# Patient Record
Sex: Female | Born: 1955 | Race: White | Hispanic: No | Marital: Married | Smoking: Former smoker
Health system: Southern US, Community
[De-identification: ages and names within clinical notes are randomized; demographics above are authoritative.]

## PROBLEM LIST (undated history)

## (undated) DIAGNOSIS — G500 Trigeminal neuralgia: Secondary | ICD-10-CM

## (undated) DIAGNOSIS — Z86010 Personal history of colonic polyps: Secondary | ICD-10-CM

## (undated) DIAGNOSIS — G43909 Migraine, unspecified, not intractable, without status migrainosus: Secondary | ICD-10-CM

## (undated) DIAGNOSIS — F17200 Nicotine dependence, unspecified, uncomplicated: Secondary | ICD-10-CM

## (undated) HISTORY — DX: Nicotine dependence, unspecified, uncomplicated: F17.200

## (undated) HISTORY — PX: TUBAL LIGATION: SHX77

## (undated) HISTORY — PX: KNEE ARTHROSCOPY: SUR90

## (undated) HISTORY — PX: APPENDECTOMY: SHX54

## (undated) HISTORY — PX: COLONOSCOPY: SHX174

## (undated) HISTORY — DX: Personal history of colonic polyps: Z86.010

## (undated) HISTORY — DX: Trigeminal neuralgia: G50.0

## (undated) HISTORY — DX: Migraine, unspecified, not intractable, without status migrainosus: G43.909

---

## 1993-09-07 HISTORY — PX: BRAIN SURGERY: SHX531

## 2001-05-09 ENCOUNTER — Emergency Department (HOSPITAL_COMMUNITY): Admission: EM | Admit: 2001-05-09 | Discharge: 2001-05-09 | Payer: Self-pay | Admitting: Emergency Medicine

## 2004-07-05 ENCOUNTER — Emergency Department (HOSPITAL_COMMUNITY): Admission: AD | Admit: 2004-07-05 | Discharge: 2004-07-05 | Payer: Self-pay | Admitting: Family Medicine

## 2006-07-26 ENCOUNTER — Ambulatory Visit: Payer: Self-pay | Admitting: Family Medicine

## 2006-07-26 ENCOUNTER — Encounter: Admission: RE | Admit: 2006-07-26 | Discharge: 2006-07-26 | Payer: Self-pay | Admitting: Family Medicine

## 2007-01-13 ENCOUNTER — Emergency Department (HOSPITAL_COMMUNITY): Admission: EM | Admit: 2007-01-13 | Discharge: 2007-01-14 | Payer: Self-pay | Admitting: Emergency Medicine

## 2007-05-09 ENCOUNTER — Emergency Department (HOSPITAL_COMMUNITY): Admission: EM | Admit: 2007-05-09 | Discharge: 2007-05-09 | Payer: Self-pay | Admitting: Emergency Medicine

## 2007-05-18 ENCOUNTER — Ambulatory Visit: Payer: Self-pay | Admitting: Internal Medicine

## 2007-06-02 ENCOUNTER — Ambulatory Visit: Payer: Self-pay | Admitting: Internal Medicine

## 2007-07-07 ENCOUNTER — Ambulatory Visit: Payer: Self-pay | Admitting: Internal Medicine

## 2008-07-04 ENCOUNTER — Ambulatory Visit: Payer: Self-pay | Admitting: Family Medicine

## 2008-07-10 ENCOUNTER — Ambulatory Visit: Payer: Self-pay | Admitting: Internal Medicine

## 2008-07-19 ENCOUNTER — Ambulatory Visit: Payer: Self-pay | Admitting: Family Medicine

## 2008-07-19 ENCOUNTER — Encounter: Payer: Self-pay | Admitting: Family Medicine

## 2008-07-19 ENCOUNTER — Other Ambulatory Visit: Admission: RE | Admit: 2008-07-19 | Discharge: 2008-07-19 | Payer: Self-pay | Admitting: Family Medicine

## 2008-07-19 LAB — HM PAP SMEAR: HM Pap smear: NEGATIVE

## 2008-07-24 ENCOUNTER — Ambulatory Visit: Payer: Self-pay | Admitting: Internal Medicine

## 2008-07-24 ENCOUNTER — Encounter: Payer: Self-pay | Admitting: Internal Medicine

## 2008-07-24 DIAGNOSIS — Z86010 Personal history of colonic polyps: Secondary | ICD-10-CM

## 2008-07-24 HISTORY — DX: Personal history of colonic polyps: Z86.010

## 2008-07-24 LAB — HM COLONOSCOPY

## 2008-07-30 ENCOUNTER — Encounter: Payer: Self-pay | Admitting: Internal Medicine

## 2009-01-24 LAB — HM MAMMOGRAPHY

## 2009-04-01 ENCOUNTER — Ambulatory Visit: Payer: Self-pay | Admitting: Family Medicine

## 2010-08-04 ENCOUNTER — Emergency Department (HOSPITAL_COMMUNITY)
Admission: EM | Admit: 2010-08-04 | Discharge: 2010-08-04 | Payer: Self-pay | Source: Home / Self Care | Admitting: Emergency Medicine

## 2010-11-18 LAB — POCT RAPID STREP A (OFFICE): Streptococcus, Group A Screen (Direct): NEGATIVE

## 2011-01-20 NOTE — Assessment & Plan Note (Signed)
[facility]                             PULMONARY OFFICE NOTE   NAME:[name] [name], [name] [name]                  MRN:          [id number]  DATE:07/07/2007                            DOB:          07-26-1956    HISTORY:  A 55 year old white female with apparent asthmatic bronchitis  while actively smoking, treated with Symbicort and the recommendation  that she stop smoking.  She was able effectively eliminate cigarettes in  September, and comes back today having also eliminated Symbicort with no  relapse over the last week off the medication.  During that time, she  was able to enjoy full activities (although no aerobics) with no  significant cough, dyspnea, chest pain, fevers, chills, sweats, or leg  swelling.   PHYSICAL EXAMINATION:  She is a pleasant ambulatory white female in no  acute distress.  She is afebrile with stable vital signs.  HEENT:  Unremarkable.  Oropharynx clear.  LUNG FIELDS:  Clear bilaterally to auscultation and percussion.  HEART:  Regular rhythm without murmur, gallop, or rub.  ABDOMEN:  Soft and benign.  EXTREMITIES:  Warm without calf tenderness, cyanosis, clubbing, or  edema.   PFTs were performed today, and revealed and FEV1 of 95% with a ratio of  73%, and a normal diffusing capacity.   IMPRESSION:  Asthmatic bronchitis has completely resolved off  cigarettes, and I agree with the patient that she probably does not need  Symbicort.  However, Symbicort is not approved for p.r.n. use, and I  warned her that if she begins to have cough, wheeze, or shortness of  breath for any reason, she should go back on 2 puffs b.i.d. dose (one  could argue that she could then be maintained on a lower maintenance of  the 80/4.5 rather than such a high strength).   The good news, however, is that since she has completely stopped  smoking, she is now symptom free.  She had a followup chest x-ray done  on June 02, 2007 that showed no  significant residual infiltrates  from her recent pneumonia, and therefore, Pulmonary followup can be on a  p.r.n. basis.     Charlaine Dalton. Sherene Sires, MD, [facility]  Electronically Signed    MBW/MedQ  DD: 07/07/2007  DT: 07/08/2007  Job #: [id number]   cc:   Sharlot Gowda, M.D.

## 2011-01-20 NOTE — Assessment & Plan Note (Signed)
[facility]                             PULMONARY OFFICE NOTE   NAME:[name] [name], [name] [name]                  MRN:          [id number]  DATE:06/02/2007                            DOB:          1956-06-03    HISTORY OF PRESENT ILLNESS:  This patient is a 55 year old white female  patient of Dr. Thurston Hole who was recently seen in the office two weeks ago  for persistent cough, congestion, shortness of breath.  Patient was felt  to be having a smoking-related asthmatic bronchitic slow to resolve  exacerbation.  She was recommended on smoking cessation.  Symbicort  160/4.5, two puffs twice daily, Levaquin 750 mg for 5 days, along with a  prednisone taper.  Chest x-ray did show some left lower lobe atelectatic  changes versus air space disease.  The patient returns today reporting  that she is much improved with decreased cough, congestion and shortness  of breath.  The patient denies any hemoptysis, orthopnea, PND or leg  swelling.   PAST MEDICAL HISTORY:  Reviewed.   CURRENT MEDICATIONS:  Reviewed.   PHYSICAL EXAMINATION:  GENERAL:  The patient is a pleasant female, no  acute distress.  VITAL SIGNS:  She is afebrile with stable vital signs.  O2 saturation  98% on room air.  HEENT:  Unremarkable.  NECK:  Supple without cervical adenopathy.  No JVD.  LUNGS:  Sounds reveal coarse breath sounds without any wheezing or  crackles.  CARDIAC:  Regular rate.  ABDOMEN:  Soft and nontender.  EXTREMITIES:  Warm without any edema.   IMPRESSION/PLAN:  Asthmatic bronchitic exacerbation with possible left  lower lobe pneumonia.  Chest x-ray today shows clearing of the left  lower lobe markings.  The patient is recommended to continue on  Symbicort 160/4.5 two puffs twice daily, paired along with Mucinex DM 1  to 2 twice a daily as needed for cough and congestion.  She is  encouraged on smoking cessation and has been recommended to finish  Zegerid for the next 10 days  and then discontinue it.  She will recheck  here back in two weeks, or sooner if needed.      Rubye Oaks, NP  Electronically Signed      Charlaine Dalton. Sherene Sires, MD, [facility]  Electronically Signed   TP/MedQ  DD: 06/02/2007  DT: 06/03/2007  Job #: [id number]

## 2011-01-20 NOTE — Assessment & Plan Note (Signed)
[facility]ALTHCARE                             PULMONARY OFFICE NOTE   NAME:[name], [name] ORZECHOWSKI                  MRN:          [id number]  DATE:05/18/2007                            DOB:          1956-02-12    PULMONARY NEW PATIENT EVALUATION   CHIEF COMPLAINT:  Dyspnea.   HISTORY:  A 55 year old white female with a history 5 years ago of an  exacerbation of chest discomfort resulting in an ER trip with 100%  resolution shortly after she came home from the ER.  She had another  exacerbation on May 1 similar to the first where she felt like a  chest was sitting on her chest, which would be suggestive for angina,  except that the chest discomfort was much worse with a deep breath and  midline in nature.  She went to the emergency room again and was  discharged after about a half a day, and only transiently improved  after antibiotics, inhalers, and prednisone.  She comes in now with an  acute exacerbation that has been present for 3 weeks that initiated with  a bad sore throat, but presently is adding a rattling cough that  bothers her 24 hours a day, especially when she lies down at night.  Dyspnea with minimal exertion.  No pleuritic pain, fevers, chills,  sweats, dysphagia, or overt sinus or reflux symptoms.   PAST MEDICAL HISTORY:  Significant for pleurisy previously diagnosed  in May of 2008.  She is status post appendectomy.   ALLERGIES:  CORTISONE.  Nonspecific reaction.   MEDICATIONS:  Ibuprofen p.r.n., which does not help much.   SOCIAL HISTORY:  She quit smoking 2 weeks ago, works at [employer].   FAMILY HISTORY:  Taken in detail on the worksheet and negative, except  as outlined above.   REVIEW OF SYSTEMS:  Taken in detail on the worksheet and negative,  except as outlined above.   PHYSICAL EXAMINATION:  This is a pleasant ambulatory white female with a  rattling, junky-sounding cough.  VITAL SIGNS:  She is afebrile.  Stable vital signs.  HEENT:  Unremarkable.  Oropharynx is clear.  Dentition intact.  Nasal  turbinates reveal moderate nonspecific edema.  Ear canals clear  bilaterally.  NECK:  Supple without cervical adenopathy or tenderness. Trachea is  midline.  No thyromegaly.  LUNGS:  The lung fields reveal junky inspiratory and expiratory rhonchi  with diminished but adequate air movement.  CARDIAC:  Regular rate and rhythm without murmur, gallop, or rub.  ABDOMEN:  Soft and benign.  EXTREMITIES:  Warm without calf tenderness, cyanosis, clubbing, or  edema.   Chest x-ray shows minimal increased markings at the left base.   IMPRESSION:  Classic smoking-related asthmatic bronchitis on this  occasion, exacerbated by perhaps reflux, because all of this started  with a bad sore throat without fever or purulent sputum.   RECOMMENDATIONS:  Continue off cigarettes and maintain a reflux diet,  along with Zegerid 40 mg at bedtime empirically, Symbicort 161/4.5 two  puffs b.i.d. (she mastered this technique fairly quickly), and empiric  Levaquin 750 for 5 days only.  If coughing, Mucinex DM 2 b.i.d., and I recommended also a 6-day course  of prednisone to control airways inflammation noting that she has had  reactions to CORTISONE before, but nothing that sounds like  significant allergies.  Followup will be in 2 weeks to evaluate response  to therapy.  Sooner p.r.n.     Charlaine Dalton. Sherene Sires, MD, [facility]  Electronically Signed    MBW/MedQ  DD: 05/18/2007  DT: 05/19/2007  Job #: [id number]

## 2011-04-27 ENCOUNTER — Encounter: Payer: Self-pay | Admitting: Family Medicine

## 2011-04-27 ENCOUNTER — Other Ambulatory Visit (HOSPITAL_COMMUNITY)
Admission: RE | Admit: 2011-04-27 | Discharge: 2011-04-27 | Disposition: A | Discharge status: Home / Self Care | Payer: BC Managed Care – PPO | Source: Ambulatory Visit | Attending: Family Medicine | Admitting: Family Medicine

## 2011-04-27 ENCOUNTER — Ambulatory Visit (INDEPENDENT_AMBULATORY_CARE_PROVIDER_SITE_OTHER): Payer: BC Managed Care – PPO | Admitting: Family Medicine

## 2011-04-27 VITALS — BP 110/70 | HR 81 | Ht 63.0 in | Wt 148.0 lb

## 2011-04-27 DIAGNOSIS — D1739 Benign lipomatous neoplasm of skin and subcutaneous tissue of other sites: Secondary | ICD-10-CM

## 2011-04-27 DIAGNOSIS — Z0000 Encounter for general adult medical examination without abnormal findings: Secondary | ICD-10-CM

## 2011-04-27 DIAGNOSIS — R319 Hematuria, unspecified: Secondary | ICD-10-CM

## 2011-04-27 DIAGNOSIS — Q828 Other specified congenital malformations of skin: Secondary | ICD-10-CM

## 2011-04-27 DIAGNOSIS — Z23 Encounter for immunization: Secondary | ICD-10-CM

## 2011-04-27 DIAGNOSIS — Z01419 Encounter for gynecological examination (general) (routine) without abnormal findings: Secondary | ICD-10-CM | POA: Insufficient documentation

## 2011-04-27 DIAGNOSIS — D171 Benign lipomatous neoplasm of skin and subcutaneous tissue of trunk: Secondary | ICD-10-CM

## 2011-04-27 DIAGNOSIS — Z8249 Family history of ischemic heart disease and other diseases of the circulatory system: Secondary | ICD-10-CM

## 2011-04-27 LAB — POCT URINALYSIS DIPSTICK
Bilirubin, UA: NEGATIVE
Glucose, UA: NEGATIVE
Ketones, UA: NEGATIVE
Leukocytes, UA: NEGATIVE
Nitrite, UA: NEGATIVE
Spec Grav, UA: 1.01
Urobilinogen, UA: NEGATIVE
pH, UA: 6

## 2011-04-27 MED ORDER — SULFAMETHOXAZOLE-TMP DS 800-160 MG PO TABS: 1.0000 | ORAL_TABLET | Freq: Two times a day (BID) | ORAL | Status: AC

## 2011-04-27 NOTE — Patient Instructions (Signed)
Take the antibiotic and I will recheck your urine in 2 weeks.

## 2011-04-27 NOTE — Progress Notes (Signed)
  Subjective:    Patient ID: [name] [name], female    DOB: 1955-11-16, 55 y.o.   MRN: [id number]  HPI    Review of Systems     Objective:   Physical Exam BP 110/70  Pulse 81  Ht 5\' 3"  (1.6 m)  Wt 148 lb (67.132 kg)  BMI 26.22 kg/m2  General Appearance:    Alert, cooperative, no distress, appears stated age  Head:    Normocephalic, without obvious abnormality, atraumatic  Eyes:    PERRL, conjunctiva/corneas clear, EOM's intact, fundi    benign  Ears:    Normal TM's and external ear canals  Nose:   Nares normal, mucosa normal, no drainage or sinus   tenderness  Throat:   Lips, mucosa, and tongue normal; teeth and gums normal  Neck:   Supple, no lymphadenopathy;  thyroid:  no   enlargement/tenderness/nodules; no carotid   bruit or JVD  Back:    Spine nontender, no curvature, ROM normal, no CVA     tenderness  Lungs:     Clear to auscultation bilaterally without wheezes, rales or     ronchi; respirations unlabored  Chest Wall:    No tenderness or deformity.a round smooth movable lesion is noted in the left lateral rib area    Heart:    Regular rate and rhythm, S1 and S2 normal, no murmur, rub   or gallop  Breast Exam:    No tenderness, masses, or nipple discharge or inversion.      No axillary lymphadenopathy  Abdomen:     Soft, non-tender, nondistended, normoactive bowel sounds,    no masses, no hepatosplenomegaly  Genitalia:    Normal external genitalia without lesions.  BUS and vagina normal; cervix without lesions, or cervical motion tenderness. No abnormal vaginal discharge.  Uterus and adnexa not enlarged, nontender, no masses.  Pap performed  Rectal:    Normal tone, no masses or tenderness; guaiac negative stool  Extremities:   No clubbing, cyanosis or edema  Pulses:   2+ and symmetric all extremities  Skin:   Skin color, texture, turgor normal, no rashes or lesions.skin tags noted on the neck and anterior chest   Lymph nodes:   Cervical, supraclavicular, and axillary  nodes normal  Neurologic:   CNII-XII intact, normal strength, sensation and gait; reflexes 2+ and symmetric throughout          Psych:   Normal mood, affect, hygiene and grooming.          Assessment & Plan:  Lipoma.Family hx of card disease. Hematuria.Skin tags. No therapy for the lipoma. Septra and culture,recheck in 2 wks..routine blood screen We'll also remove skin tags with her next visit.

## 2011-04-28 ENCOUNTER — Telehealth: Payer: Self-pay

## 2011-04-28 LAB — COMPREHENSIVE METABOLIC PANEL
ALT: 29 U/L (ref 0–35)
AST: 22 U/L (ref 0–37)
Albumin: 4.8 g/dL (ref 3.5–5.2)
Alkaline Phosphatase: 53 U/L (ref 39–117)
BUN: 16 mg/dL (ref 6–23)
CO2: 24 mEq/L (ref 19–32)
Calcium: 9.4 mg/dL (ref 8.4–10.5)
Chloride: 104 mEq/L (ref 96–112)
Creat: 0.83 mg/dL (ref 0.50–1.10)
Glucose, Bld: 87 mg/dL (ref 70–99)
Potassium: 4.5 mEq/L (ref 3.5–5.3)
Sodium: 139 mEq/L (ref 135–145)
Total Bilirubin: 0.5 mg/dL (ref 0.3–1.2)
Total Protein: 7.2 g/dL (ref 6.0–8.3)

## 2011-04-28 LAB — CBC WITH DIFFERENTIAL/PLATELET
Basophils Absolute: 0.1 10*3/uL (ref 0.0–0.1)
Basophils Relative: 1 % (ref 0–1)
Eosinophils Absolute: 0.3 10*3/uL (ref 0.0–0.7)
Eosinophils Relative: 3 % (ref 0–5)
HCT: 48.7 % — ABNORMAL HIGH (ref 36.0–46.0)
Hemoglobin: 16.5 g/dL — ABNORMAL HIGH (ref 12.0–15.0)
Lymphocytes Relative: 24 % (ref 12–46)
Lymphs Abs: 2.7 10*3/uL (ref 0.7–4.0)
MCH: 31.1 pg (ref 26.0–34.0)
MCHC: 33.9 g/dL (ref 30.0–36.0)
MCV: 91.9 fL (ref 78.0–100.0)
Monocytes Absolute: 0.8 10*3/uL (ref 0.1–1.0)
Monocytes Relative: 7 % (ref 3–12)
Neutro Abs: 7.3 10*3/uL (ref 1.7–7.7)
Neutrophils Relative %: 65 % (ref 43–77)
Platelets: 265 10*3/uL (ref 150–400)
RBC: 5.3 MIL/uL — ABNORMAL HIGH (ref 3.87–5.11)
RDW: 13.9 % (ref 11.5–15.5)
WBC: 11.1 10*3/uL — ABNORMAL HIGH (ref 4.0–10.5)

## 2011-04-28 LAB — LIPID PANEL
Cholesterol: 206 mg/dL — ABNORMAL HIGH (ref 0–200)
HDL: 57 mg/dL (ref >39)
LDL Cholesterol: 127 mg/dL — ABNORMAL HIGH (ref 0–99)
Total CHOL/HDL Ratio: 3.6 Ratio
Triglycerides: 112 mg/dL (ref <150)
VLDL: 22 mg/dL (ref 0–40)

## 2011-04-28 NOTE — Telephone Encounter (Signed)
Called pt to inform labs look good left message 

## 2011-04-28 NOTE — Progress Notes (Signed)
Addended by: Ronnald Nian on: 04/28/2011 03:58 PM   Modules accepted: Orders

## 2011-04-29 ENCOUNTER — Telehealth: Payer: Self-pay

## 2011-04-29 NOTE — Telephone Encounter (Signed)
Called pt to tell her culture was positive left message to recheck in 2 weeks

## 2011-04-30 LAB — URINE CULTURE: Colony Count: >=100000

## 2011-05-07 ENCOUNTER — Encounter: Payer: Self-pay | Admitting: Family Medicine

## 2011-05-12 ENCOUNTER — Ambulatory Visit (INDEPENDENT_AMBULATORY_CARE_PROVIDER_SITE_OTHER): Payer: BC Managed Care – PPO | Admitting: Family Medicine

## 2011-05-12 ENCOUNTER — Encounter: Payer: Self-pay | Admitting: Family Medicine

## 2011-05-12 DIAGNOSIS — R319 Hematuria, unspecified: Secondary | ICD-10-CM

## 2011-05-12 DIAGNOSIS — L919 Hypertrophic disorder of the skin, unspecified: Secondary | ICD-10-CM

## 2011-05-12 DIAGNOSIS — L918 Other hypertrophic disorders of the skin: Secondary | ICD-10-CM

## 2011-05-12 DIAGNOSIS — L909 Atrophic disorder of skin, unspecified: Secondary | ICD-10-CM

## 2011-05-12 LAB — POCT URINALYSIS DIPSTICK
Bilirubin, UA: NEGATIVE
Blood, UA: NEGATIVE
Glucose, UA: NEGATIVE
Ketones, UA: NEGATIVE
Leukocytes, UA: NEGATIVE
Nitrite, UA: NEGATIVE
Protein, UA: NEGATIVE
Spec Grav, UA: 1.01
Urobilinogen, UA: NEGATIVE
pH, UA: 7

## 2011-05-12 NOTE — Progress Notes (Signed)
  Subjective:    Patient ID: [name] [name], female    DOB: [birth year], 55 y.o.   MRN: [id number]  HPI She is here for removal of several lesions. Also have a urinalysis recheck. She had no symptoms before and remains so now.   Review of Systems     Objective:   Physical Exam She has one lesion present on the face near the left lateral eyebrow. One on her neck on the left and one on her anterior chest. All the lesions were injected with Xylocaine and excised without difficulty. Urinalysis was negative.       Assessment & Plan:  Recheck UTI. Skin tag excision X3 one from the face. Return here as needed.

## 2011-10-28 ENCOUNTER — Encounter: Payer: Self-pay | Admitting: Internal Medicine

## 2011-10-28 LAB — HM MAMMOGRAPHY

## 2011-12-29 ENCOUNTER — Encounter: Payer: Self-pay | Admitting: Family Medicine

## 2011-12-29 ENCOUNTER — Ambulatory Visit (INDEPENDENT_AMBULATORY_CARE_PROVIDER_SITE_OTHER): Payer: BC Managed Care – PPO | Admitting: Family Medicine

## 2011-12-29 VITALS — BP 110/70 | HR 73 | Temp 98.4°F | Wt 147.0 lb

## 2011-12-29 DIAGNOSIS — N390 Urinary tract infection, site not specified: Secondary | ICD-10-CM

## 2011-12-29 DIAGNOSIS — R300 Dysuria: Secondary | ICD-10-CM

## 2011-12-29 LAB — POCT URINALYSIS DIPSTICK
Bilirubin, UA: NEGATIVE
Blood, UA: 250
Glucose, UA: NEGATIVE
Ketones, UA: NEGATIVE
Nitrite, UA: NEGATIVE
Spec Grav, UA: 1.01
Urobilinogen, UA: NEGATIVE
pH, UA: 5

## 2011-12-29 MED ORDER — SULFAMETHOXAZOLE-TRIMETHOPRIM 800-160 MG PO TABS: 1.0000 | ORAL_TABLET | Freq: Two times a day (BID) | ORAL | Status: AC

## 2011-12-29 NOTE — Progress Notes (Signed)
  Subjective:    Patient ID: [name] [name], female    DOB: [birth year], 56 y.o.   MRN: [id number]  HPI She has a two-day history of dysuria, frequent, nausea low back with some fever and chills. She is menopausal.   Review of Systems     Objective:   Physical Exam Alert and in moderate distress with some lower abdominal tenderness but no rebound. Urine microscopic was positive for white blood cells and crenated red cells.       Assessment & Plan:  UTI with hematuria. I will treat with Septra and have her return here in 2 weeks. She is also to use Azo-Standard

## 2011-12-29 NOTE — Patient Instructions (Signed)
Take all the antibiotics and come back here in about 2 weeks for repeat urinalysis. Also use Azo-Standard

## 2012-01-11 ENCOUNTER — Other Ambulatory Visit (INDEPENDENT_AMBULATORY_CARE_PROVIDER_SITE_OTHER): Payer: BC Managed Care – PPO

## 2012-01-11 DIAGNOSIS — N390 Urinary tract infection, site not specified: Secondary | ICD-10-CM

## 2012-01-11 LAB — POCT URINALYSIS DIPSTICK
Bilirubin, UA: NEGATIVE
Blood, UA: NEGATIVE
Glucose, UA: NEGATIVE
Ketones, UA: NEGATIVE
Leukocytes, UA: NEGATIVE
Nitrite, UA: NEGATIVE
Protein, UA: NEGATIVE
Spec Grav, UA: 1.01
Urobilinogen, UA: NEGATIVE
pH, UA: 5

## 2012-01-18 ENCOUNTER — Telehealth: Payer: Self-pay

## 2012-01-18 ENCOUNTER — Encounter: Payer: Self-pay | Admitting: Family Medicine

## 2012-01-18 ENCOUNTER — Ambulatory Visit (INDEPENDENT_AMBULATORY_CARE_PROVIDER_SITE_OTHER): Payer: BC Managed Care – PPO | Admitting: Family Medicine

## 2012-01-18 VITALS — BP 120/70 | HR 77 | Wt 150.0 lb

## 2012-01-18 DIAGNOSIS — N390 Urinary tract infection, site not specified: Secondary | ICD-10-CM

## 2012-01-18 DIAGNOSIS — R350 Frequency of micturition: Secondary | ICD-10-CM

## 2012-01-18 LAB — POCT URINALYSIS DIPSTICK
Bilirubin, UA: NEGATIVE
Glucose, UA: NEGATIVE
Ketones, UA: NEGATIVE
Nitrite, UA: POSITIVE
Spec Grav, UA: 1.025
Urobilinogen, UA: NEGATIVE
pH, UA: 5

## 2012-01-18 MED ORDER — CIPROFLOXACIN HCL 500 MG PO TABS: 500.0000 mg | ORAL_TABLET | Freq: Two times a day (BID) | ORAL | Status: AC

## 2012-01-18 NOTE — Progress Notes (Signed)
  Subjective:    Patient ID: [name] [name], female    DOB: 12-18-1955, 56 y.o.   MRN: [id number]  HPI She is here for recheck concerning continued difficulty with urgency and frequency. She was treated for a urinary tract infection recently and followup urinalysis was negative. At the time of urinalysis she was having very little in the way of symptoms whenever the frequency and urgency have reoccurred.   Review of Systems     Objective:   Physical Exam And in no distress. Abdominal exam shows no tenderness palpation. Urine microscopic did show white cells and bacteria.      Assessment & Plan:   1. Frequent urination  POCT Urinalysis Dipstick  2. UTI (lower urinary tract infection)  Urine culture, ciprofloxacin (CIPRO) 500 MG tablet   Followup pending culture results

## 2012-01-18 NOTE — Telephone Encounter (Signed)
Pt dropped off urine Monday and looks like normal she called today said she is having the burning and urgency please advise

## 2012-01-18 NOTE — Telephone Encounter (Signed)
Dr.Lalonde wants [name] [name] to come in called [name] [name] and advised [name] [name] is coming in [place]

## 2012-01-18 NOTE — Telephone Encounter (Signed)
She needs an appointment.

## 2012-01-21 ENCOUNTER — Other Ambulatory Visit: Payer: Self-pay

## 2012-01-21 DIAGNOSIS — N390 Urinary tract infection, site not specified: Secondary | ICD-10-CM

## 2012-01-21 LAB — URINE CULTURE: Colony Count: >=100000

## 2012-04-20 ENCOUNTER — Other Ambulatory Visit: Payer: Self-pay | Admitting: Family Medicine

## 2012-04-20 DIAGNOSIS — N63 Unspecified lump in unspecified breast: Secondary | ICD-10-CM

## 2012-04-21 ENCOUNTER — Telehealth: Payer: Self-pay | Admitting: Internal Medicine

## 2012-04-21 ENCOUNTER — Encounter: Payer: Self-pay | Admitting: Internal Medicine

## 2012-04-21 NOTE — Telephone Encounter (Signed)
Dr. Susann Givens has requested that she get a 2nd opinion on her mammogram with another company. i have sent pt to the [facility] center [facility] located at [address] Wednesday April 27, 2012 @ 2:45pm. Pt is to call or go by [facility] to get images of bilateral and unilateral back from feb and august of 2012 and also the office notes as well to be seen at her appt on Wednesday august 21.

## 2012-04-28 ENCOUNTER — Other Ambulatory Visit: Payer: Self-pay | Admitting: Family Medicine

## 2012-04-28 ENCOUNTER — Encounter: Payer: Self-pay | Admitting: Family Medicine

## 2012-04-28 ENCOUNTER — Ambulatory Visit
Admission: RE | Admit: 2012-04-28 | Discharge: 2012-04-28 | Disposition: A | Discharge status: Home / Self Care | Payer: BC Managed Care – PPO | Source: Ambulatory Visit | Attending: Family Medicine | Admitting: Family Medicine

## 2012-04-28 ENCOUNTER — Ambulatory Visit (INDEPENDENT_AMBULATORY_CARE_PROVIDER_SITE_OTHER): Payer: BC Managed Care – PPO | Admitting: Family Medicine

## 2012-04-28 VITALS — BP 112/70 | HR 74 | Ht 63.0 in | Wt 153.0 lb

## 2012-04-28 DIAGNOSIS — N63 Unspecified lump in unspecified breast: Secondary | ICD-10-CM

## 2012-04-28 DIAGNOSIS — F17200 Nicotine dependence, unspecified, uncomplicated: Secondary | ICD-10-CM

## 2012-04-28 DIAGNOSIS — Z0000 Encounter for general adult medical examination without abnormal findings: Secondary | ICD-10-CM

## 2012-04-28 LAB — LIPID PANEL
Cholesterol: 194 mg/dL (ref 0–200)
HDL: 56 mg/dL (ref >39)
LDL Cholesterol: 105 mg/dL — ABNORMAL HIGH (ref 0–99)
Total CHOL/HDL Ratio: 3.5 Ratio
Triglycerides: 163 mg/dL — ABNORMAL HIGH (ref <150)
VLDL: 33 mg/dL (ref 0–40)

## 2012-04-28 LAB — POCT URINALYSIS DIPSTICK
Bilirubin, UA: NEGATIVE
Blood, UA: NEGATIVE
Glucose, UA: NEGATIVE
Ketones, UA: NEGATIVE
Leukocytes, UA: NEGATIVE
Nitrite, UA: NEGATIVE
Protein, UA: NEGATIVE
Spec Grav, UA: 1.02
Urobilinogen, UA: NEGATIVE
pH, UA: 6

## 2012-04-28 NOTE — Progress Notes (Signed)
  Subjective:    Patient ID: [name] [name], female    DOB: 1956/08/12, 56 y.o.   MRN: [id number]  HPI She is here for complete examination. She has no particular concerns or complaints. She does smoke intermittently and usually it's related to stress and dealing with family issues. She has no other concerns or complaints. She does see her gynecologist. Review of her record indicates she is up-to-date on her immunizations as well as mammogram and colonoscopy.   Review of Systems  Constitutional: Negative.   HENT: Negative.   Eyes: Negative.   Respiratory: Negative.   Cardiovascular: Negative.   Gastrointestinal: Negative.   Genitourinary: Negative.   Musculoskeletal: Negative.   Neurological: Negative.   Hematological: Negative.   Psychiatric/Behavioral: Negative.        Objective:   Physical Exam BP 112/70  Pulse 74  Ht 5\' 3"  (1.6 m)  Wt 153 lb (69.4 kg)  BMI 27.10 kg/m2  SpO2 96%  General Appearance:    Alert, cooperative, no distress, appears stated age  Head:    Normocephalic, without obvious abnormality, atraumatic  Eyes:    PERRL, conjunctiva/corneas clear, EOM's intact, fundi    benign  Ears:    Normal TM's and external ear canals  Nose:   Nares normal, mucosa normal, no drainage or sinus   tenderness  Throat:   Lips, mucosa, and tongue normal; teeth and gums normal  Neck:   Supple, no lymphadenopathy;  thyroid:  no   enlargement/tenderness/nodules; no carotid   bruit or JVD  Back:    Spine nontender, no curvature, ROM normal, no CVA     tenderness  Lungs:     Clear to auscultation bilaterally without wheezes, rales or     ronchi; respirations unlabored  Chest Wall:    No tenderness or deformity   Heart:    Regular rate and rhythm, S1 and S2 normal, no murmur, rub   or gallop  Breast Exam:    Deferred to GYN  Abdomen:     Soft, non-tender, nondistended, normoactive bowel sounds,    no masses, no hepatosplenomegaly  Genitalia:    Deferred to GYN       Extremities:   No clubbing, cyanosis or edema  Pulses:   2+ and symmetric all extremities  Skin:   Skin color, texture, turgor normal, no rashes or lesions  Lymph nodes:   Cervical, supraclavicular, and axillary nodes normal  Neurologic:   CNII-XII intact, normal strength, sensation and gait; reflexes 2+ and symmetric throughout          Psych:   Normal mood, affect, hygiene and grooming.           Assessment & Plan:   1. Routine general medical examination at a health care facility  POCT Urinalysis Dipstick, Lipid panel  2. Current smoker     discussed smoking cessation with her. She really isn't smoking that much sweating pursue the issue. Discussed distress and she is on her with her family situation and encouraged her to have a meeting with her daughter to find out what the plans are for the daughter and grandchildren to leave now that the daughter has a full-time job.

## 2013-05-15 ENCOUNTER — Other Ambulatory Visit: Payer: Self-pay

## 2013-05-15 DIAGNOSIS — Z1231 Encounter for screening mammogram for malignant neoplasm of breast: Secondary | ICD-10-CM

## 2013-06-08 ENCOUNTER — Encounter: Payer: Self-pay | Admitting: Family Medicine

## 2013-06-08 ENCOUNTER — Ambulatory Visit (INDEPENDENT_AMBULATORY_CARE_PROVIDER_SITE_OTHER): Payer: 59 | Admitting: Family Medicine

## 2013-06-08 VITALS — BP 118/78 | HR 80 | Ht 64.0 in | Wt 152.0 lb

## 2013-06-08 DIAGNOSIS — M129 Arthropathy, unspecified: Secondary | ICD-10-CM

## 2013-06-08 DIAGNOSIS — F17200 Nicotine dependence, unspecified, uncomplicated: Secondary | ICD-10-CM

## 2013-06-08 DIAGNOSIS — Z23 Encounter for immunization: Secondary | ICD-10-CM

## 2013-06-08 DIAGNOSIS — M1990 Unspecified osteoarthritis, unspecified site: Secondary | ICD-10-CM

## 2013-06-08 DIAGNOSIS — Z803 Family history of malignant neoplasm of breast: Secondary | ICD-10-CM | POA: Insufficient documentation

## 2013-06-08 DIAGNOSIS — Z0000 Encounter for general adult medical examination without abnormal findings: Secondary | ICD-10-CM

## 2013-06-08 LAB — CBC WITH DIFFERENTIAL/PLATELET
Basophils Relative: 0 % (ref 0–1)
Eosinophils Absolute: 0.3 10*3/uL (ref 0.0–0.7)
Hemoglobin: 16.1 g/dL — ABNORMAL HIGH (ref 12.0–15.0)
Lymphocytes Relative: 36 % (ref 12–46)
Lymphs Abs: 3.3 10*3/uL (ref 0.7–4.0)
MCH: 31.9 pg (ref 26.0–34.0)
MCV: 88.9 fL (ref 78.0–100.0)
Monocytes Absolute: 0.6 10*3/uL (ref 0.1–1.0)
Monocytes Relative: 6 % (ref 3–12)
Neutrophils Relative %: 54 % (ref 43–77)
Platelets: 293 10*3/uL (ref 150–400)
RBC: 5.04 MIL/uL (ref 3.87–5.11)
RDW: 13.7 % (ref 11.5–15.5)
WBC: 9.1 10*3/uL (ref 4.0–10.5)

## 2013-06-08 LAB — COMPREHENSIVE METABOLIC PANEL
ALT: 41 U/L — ABNORMAL HIGH (ref 0–35)
Albumin: 4.6 g/dL (ref 3.5–5.2)
Alkaline Phosphatase: 51 U/L (ref 39–117)
CO2: 29 mEq/L (ref 19–32)
Calcium: 9.8 mg/dL (ref 8.4–10.5)
Chloride: 104 mEq/L (ref 96–112)
Glucose, Bld: 81 mg/dL (ref 70–99)
Sodium: 139 mEq/L (ref 135–145)
Total Protein: 7.2 g/dL (ref 6.0–8.3)

## 2013-06-08 LAB — POCT URINALYSIS DIPSTICK
Bilirubin, UA: NEGATIVE
Blood, UA: NEGATIVE
Glucose, UA: NEGATIVE
Ketones, UA: NEGATIVE
Leukocytes, UA: NEGATIVE
Protein, UA: NEGATIVE
Spec Grav, UA: 1.01
Urobilinogen, UA: NEGATIVE
pH, UA: 5

## 2013-06-08 LAB — LIPID PANEL
Cholesterol: 207 mg/dL — ABNORMAL HIGH (ref 0–200)
Triglycerides: 107 mg/dL (ref <150)

## 2013-06-08 NOTE — Progress Notes (Signed)
Subjective:    Patient ID: [name] [name], female    DOB: [birth year], 57 y.o.   MRN: [id number]  HPI She is here for complete examination. She is in the process of trying to quit smoking and she is using an electronic cigarette to help. She is not interested in Chantix. She has had difficulty with right index finger distal pain especially in the morning with motion tends to get better as the day progresses. No other joints are involved. She has had a recent mammogram. Colonoscopy was in 2009. Pap smear or 2012. She has no other concerns or complaints. Her work and home life are going well. Family and social history were reviewed.   Review of Systems  Constitutional: Negative.   HENT: Negative.   Eyes: Negative.   Respiratory: Negative.   Cardiovascular: Negative.   Gastrointestinal: Negative.   Endocrine: Negative.   Genitourinary: Negative.   Musculoskeletal: Negative.   Allergic/Immunologic: Negative.   Neurological: Negative.   Hematological: Negative.   Psychiatric/Behavioral: Negative.        Objective:   Physical Exam BP 118/78  Pulse 80  Ht 5\' 4"  (1.626 m)  Wt 152 lb (68.947 kg)  BMI 26.08 kg/m2  General Appearance:    Alert, cooperative, no distress, appears stated age  Head:    Normocephalic, without obvious abnormality, atraumatic  Eyes:    PERRL, conjunctiva/corneas clear, EOM's intact, fundi    benign  Ears:    Normal TM's and external ear canals  Nose:   Nares normal, mucosa normal, no drainage or sinus   tenderness  Throat:   Lips, mucosa, and tongue normal; teeth and gums normal  Neck:   Supple, no lymphadenopathy;  thyroid:  no   enlargement/tenderness/nodules; no carotid   bruit or JVD  Back:    Spine nontender, no curvature, ROM normal, no CVA     tenderness  Lungs:     Clear to auscultation bilaterally without wheezes, rales or     ronchi; respirations unlabored  Chest Wall:    No tenderness or deformity   Heart:    Regular rate and rhythm, S1 and  S2 normal, no murmur, rub   or gallop  Breast Exam:    Deferred to GYN  Abdomen:     Soft, non-tender, nondistended, normoactive bowel sounds,    no masses, no hepatosplenomegaly  Genitalia:    Deferred to GYN     Extremities:   No clubbing, cyanosis or edema  Pulses:   2+ and symmetric all extremities  Skin:   Skin color, texture, turgor normal, no rashes or lesions  Lymph nodes:   Cervical, supraclavicular, and axillary nodes normal  Neurologic:   CNII-XII intact, normal strength, sensation and gait; reflexes 2+ and symmetric throughout          Psych:   Normal mood, affect, hygiene and grooming.          Assessment & Plan:  Routine general medical examination at a health care facility - Plan: POCT Urinalysis Dipstick, CBC with Differential, Comprehensive metabolic panel, Lipid panel  Family history of breast cancer in mother  Current smoker  Arthritis  Need for prophylactic vaccination and inoculation against unspecified single disease - Plan: Flu Vaccine QUAD 36+ mos IM  she will get yearly mammograms. Encouraged her to continue with the smoking cessation and gave her the 800 number to call. Discussed the treatment of her arthritis with Tylenol progressing to NSAID as needed. Flu shot given with risks and benefits  discussed.

## 2013-06-08 NOTE — Patient Instructions (Addendum)
Call 800 quit now Try Tylenol the night before you go to bed for the arthritis and again in the morning if you need it. If that doesn't work then switch to Advil or Aleve

## 2013-06-09 ENCOUNTER — Ambulatory Visit
Admission: RE | Admit: 2013-06-09 | Discharge: 2013-06-09 | Disposition: A | Discharge status: Home / Self Care | Payer: 59 | Source: Ambulatory Visit

## 2013-06-09 DIAGNOSIS — Z1231 Encounter for screening mammogram for malignant neoplasm of breast: Secondary | ICD-10-CM

## 2013-07-31 ENCOUNTER — Encounter: Payer: Self-pay | Admitting: Internal Medicine

## 2013-08-07 ENCOUNTER — Encounter: Payer: Self-pay | Admitting: Internal Medicine

## 2013-12-25 ENCOUNTER — Encounter: Payer: Self-pay | Admitting: Internal Medicine

## 2014-04-03 ENCOUNTER — Emergency Department (HOSPITAL_COMMUNITY)
Admission: EM | Admit: 2014-04-03 | Discharge: 2014-04-03 | Disposition: A | Discharge status: Home / Self Care | Payer: 59 | Source: Home / Self Care

## 2014-04-03 ENCOUNTER — Encounter (HOSPITAL_COMMUNITY): Payer: Self-pay | Admitting: Emergency Medicine

## 2014-04-03 DIAGNOSIS — G44209 Tension-type headache, unspecified, not intractable: Secondary | ICD-10-CM

## 2014-04-03 DIAGNOSIS — R5383 Other fatigue: Secondary | ICD-10-CM

## 2014-04-03 DIAGNOSIS — R5381 Other malaise: Secondary | ICD-10-CM

## 2014-04-03 DIAGNOSIS — G43909 Migraine, unspecified, not intractable, without status migrainosus: Secondary | ICD-10-CM

## 2014-04-03 LAB — POCT I-STAT, CHEM 8
BUN: 13 mg/dL (ref 6–23)
CALCIUM ION: 1.18 mmol/L (ref 1.12–1.23)
Chloride: 103 mEq/L (ref 96–112)
Creatinine, Ser: 0.9 mg/dL (ref 0.50–1.10)
Glucose, Bld: 99 mg/dL (ref 70–99)
HCT: 51 % — ABNORMAL HIGH (ref 36.0–46.0)
HEMOGLOBIN: 17.3 g/dL — AB (ref 12.0–15.0)
Potassium: 4.1 mEq/L (ref 3.7–5.3)
SODIUM: 139 meq/L (ref 137–147)
TCO2: 25 mmol/L (ref 0–100)

## 2014-04-03 MED ORDER — KETOROLAC TROMETHAMINE 60 MG/2ML IM SOLN
60.0000 mg | Freq: Once | INTRAMUSCULAR | Status: AC
  Administered 2014-04-03: 60 mg via INTRAMUSCULAR

## 2014-04-03 MED ORDER — NAPROXEN 375 MG PO TABS: 375.0000 mg | ORAL_TABLET | Freq: Two times a day (BID) | ORAL | Status: DC

## 2014-04-03 MED ORDER — DEXAMETHASONE SODIUM PHOSPHATE 10 MG/ML IJ SOLN: 10.0000 mg | Freq: Once | INTRAMUSCULAR | Status: DC

## 2014-04-03 MED ORDER — METHYLPREDNISOLONE SODIUM SUCC 125 MG IJ SOLR
INTRAMUSCULAR | Status: AC
  Filled 2014-04-03: qty 2

## 2014-04-03 MED ORDER — ONDANSETRON HCL 4 MG PO TABS: 4.0000 mg | ORAL_TABLET | Freq: Four times a day (QID) | ORAL | Status: DC

## 2014-04-03 MED ORDER — METHYLPREDNISOLONE SODIUM SUCC 125 MG IJ SOLR
80.0000 mg | Freq: Once | INTRAMUSCULAR | Status: AC
  Administered 2014-04-03: 80 mg via INTRAMUSCULAR

## 2014-04-03 MED ORDER — ISOMETHEPTENE-APAP-DICHLORAL 65-325-100 MG PO CAPS: 1.0000 | ORAL_CAPSULE | Freq: Four times a day (QID) | ORAL | Status: DC | PRN

## 2014-04-03 MED ORDER — KETOROLAC TROMETHAMINE 60 MG/2ML IM SOLN
INTRAMUSCULAR | Status: AC
  Filled 2014-04-03: qty 2

## 2014-04-03 MED ORDER — ONDANSETRON 4 MG PO TBDP
ORAL_TABLET | ORAL | Status: AC
  Filled 2014-04-03: qty 1

## 2014-04-03 MED ORDER — ONDANSETRON 4 MG PO TBDP
4.0000 mg | ORAL_TABLET | Freq: Once | ORAL | Status: AC
  Administered 2014-04-03: 4 mg via ORAL

## 2014-04-03 NOTE — Discharge Instructions (Signed)
Fatigue Fatigue is a feeling of tiredness, lack of energy, lack of motivation, or feeling tired all the time. Having enough rest, good nutrition, and reducing stress will normally reduce fatigue. Consult your caregiver if it persists. The nature of your fatigue will help your caregiver to find out its cause. The treatment is based on the cause.  CAUSES  There are many causes for fatigue. Most of the time, fatigue can be traced to one or more of your habits or routines. Most causes fit into one or more of three general areas. They are: Lifestyle problems  Sleep disturbances.  Overwork.  Physical exertion.  Unhealthy habits.  Poor eating habits or eating disorders.  Alcohol and/or drug use .  Lack of proper nutrition (malnutrition). Psychological problems  Stress and/or anxiety problems.  Depression.  Grief.  Boredom. Medical Problems or Conditions  Anemia.  Pregnancy.  Thyroid gland problems.  Recovery from major surgery.  Continuous pain.  Emphysema or asthma that is not well controlled  Allergic conditions.  Diabetes.  Infections (such as mononucleosis).  Obesity.  Sleep disorders, such as sleep apnea.  Heart failure or other heart-related problems.  Cancer.  Kidney disease.  Liver disease.  Effects of certain medicines such as antihistamines, cough and cold remedies, prescription pain medicines, heart and blood pressure medicines, drugs used for treatment of cancer, and some antidepressants. SYMPTOMS  The symptoms of fatigue include:   Lack of energy.  Lack of drive (motivation).  Drowsiness.  Feeling of indifference to the surroundings. DIAGNOSIS  The details of how you feel help guide your caregiver in finding out what is causing the fatigue. You will be asked about your present and past health condition. It is important to review all medicines that you take, including prescription and non-prescription items. A thorough exam will be done.  You will be questioned about your feelings, habits, and normal lifestyle. Your caregiver may suggest blood tests, urine tests, or other tests to look for common medical causes of fatigue.  TREATMENT  Fatigue is treated by correcting the underlying cause. For example, if you have continuous pain or depression, treating these causes will improve how you feel. Similarly, adjusting the dose of certain medicines will help in reducing fatigue.  HOME CARE INSTRUCTIONS   Try to get the required amount of good sleep every night.  Eat a healthy and nutritious diet, and drink enough water throughout the day.  Practice ways of relaxing (including yoga or meditation).  Exercise regularly.  Make plans to change situations that cause stress. Act on those plans so that stresses decrease over time. Keep your work and personal routine reasonable.  Avoid street drugs and minimize use of alcohol.  Start taking a daily multivitamin after consulting your caregiver. SEEK MEDICAL CARE IF:   You have persistent tiredness, which cannot be accounted for.  You have fever.  You have unintentional weight loss.  You have headaches.  You have disturbed sleep throughout the night.  You are feeling sad.  You have constipation.  You have dry skin.  You have gained weight.  You are taking any new or different medicines that you suspect are causing fatigue.  You are unable to sleep at night.  You develop any unusual swelling of your legs or other parts of your body. SEEK IMMEDIATE MEDICAL CARE IF:   You are feeling confused.  Your vision is blurred.  You feel faint or pass out.  You develop severe headache.  You develop severe abdominal, pelvic, or  back pain.  You develop chest pain, shortness of breath, or an irregular or fast heartbeat.  You are unable to pass a normal amount of urine.  You develop abnormal bleeding such as bleeding from the rectum or you vomit blood.  You have thoughts  about harming yourself or committing suicide.  You are worried that you might harm someone else. MAKE SURE YOU:   Understand these instructions.  Will watch your condition.  Will get help right away if you are not doing well or get worse. Document Released: 06/21/2007 Document Revised: 11/16/2011 Document Reviewed: 12/26/2013 [facility] Patient Information 2015 [place], [place]. This information is not intended to replace advice given to you by your health care provider. Make sure you discuss any questions you have with your health care provider.  Migraine Headache A migraine headache is very bad, throbbing pain on one or both sides of your head. Talk to your doctor about what things may bring on (trigger) your migraine headaches. HOME CARE  Only take medicines as told by your doctor.  Lie down in a dark, quiet room when you have a migraine.  Keep a journal to find out if certain things bring on migraine headaches. For example, write down:  What you eat and drink.  How much sleep you get.  Any change to your diet or medicines.  Lessen how much alcohol you drink.  Quit smoking if you smoke.  Get enough sleep.  Lessen any stress in your life.  Keep lights dim if bright lights bother you or make your migraines worse. GET HELP RIGHT AWAY IF:   Your migraine becomes really bad.  You have a fever.  You have a stiff neck.  You have trouble seeing.  Your muscles are weak, or you lose muscle control.  You lose your balance or have trouble walking.  You feel like you will pass out (faint), or you pass out.  You have really bad symptoms that are different than your first symptoms. MAKE SURE YOU:   Understand these instructions.  Will watch your condition.  Will get help right away if you are not doing well or get worse. Document Released: 06/02/2008 Document Revised: 11/16/2011 Document Reviewed: 05/01/2013 [facility] Patient Information 2015 [place], [place]. This  information is not intended to replace advice given to you by your health care provider. Make sure you discuss any questions you have with your health care provider.  Recurrent Migraine Headache A migraine headache is very bad, throbbing pain on one or both sides of your head. Recurrent migraines keep coming back. Talk to your doctor about what things may bring on (trigger) your migraine headaches. HOME CARE  Only take medicines as told by your doctor.  Lie down in a dark, quiet room when you have a migraine.  Keep a journal to find out if certain things bring on migraine headaches. For example, write down:  What you eat and drink.  How much sleep you get.  Any change to your diet or medicines.  Lessen how much alcohol you drink.  Quit smoking if you smoke.  Get enough sleep.  Lessen any stress in your life.  Keep lights dim if bright lights bother you or make your migraines worse. GET HELP IF:  Medicine does not help your migraines.  Your pain keeps coming back.  You have a fever. GET HELP RIGHT AWAY IF:   Your migraine becomes really bad.  You have a stiff neck.  You have trouble seeing.  Your muscles  are weak, or you lose muscle control.  You lose your balance or have trouble walking.  You feel like you will pass out (faint), or you pass out.  You have really bad symptoms that are different than your first symptoms. MAKE SURE YOU:   Understand these instructions.  Will watch your condition.  Will get help right away if you are not doing well or get worse. Document Released: 06/02/2008 Document Revised: 08/29/2013 Document Reviewed: 05/01/2013 [facility] Patient Information 2015 [place], [place]. This information is not intended to replace advice given to you by your health care provider. Make sure you discuss any questions you have with your health care provider.  Tension Headache A tension headache is a feeling of pain, pressure, or aching often felt over  the front and sides of the head. The pain can be dull or can feel tight (constricting). It is the most common type of headache. Tension headaches are not normally associated with nausea or vomiting and do not get worse with physical activity. Tension headaches can last 30 minutes to several days.  CAUSES  The exact cause is not known, but it may be caused by chemicals and hormones in the brain that lead to pain. Tension headaches often begin after stress, anxiety, or depression. Other triggers may include:  Alcohol.  Caffeine (too much or withdrawal).  Respiratory infections (colds, flu, sinus infections).  Dental problems or teeth clenching.  Fatigue.  Holding your head and neck in one position too long while using a computer. SYMPTOMS   Pressure around the head.   Dull, aching head pain.   Pain felt over the front and sides of the head.   Tenderness in the muscles of the head, neck, and shoulders. DIAGNOSIS  A tension headache is often diagnosed based on:   Symptoms.   Physical examination.   A CT scan or MRI of your head. These tests may be ordered if symptoms are severe or unusual. TREATMENT  Medicines may be given to help relieve symptoms.  HOME CARE INSTRUCTIONS   Only take over-the-counter or prescription medicines for pain or discomfort as directed by your caregiver.   Lie down in a dark, quiet room when you have a headache.   Keep a journal to find out what may be triggering your headaches. For example, write down:  What you eat and drink.  How much sleep you get.  Any change to your diet or medicines.  Try massage or other relaxation techniques.   Ice packs or heat applied to the head and neck can be used. Use these 3 to 4 times per day for 15 to 20 minutes each time, or as needed.   Limit stress.   Sit up straight, and do not tense your muscles.   Quit smoking if you smoke.  Limit alcohol use.  Decrease the amount of caffeine you  drink, or stop drinking caffeine.  Eat and exercise regularly.  Get 7 to 9 hours of sleep, or as recommended by your caregiver.  Avoid excessive use of pain medicine as recurrent headaches can occur.  SEEK MEDICAL CARE IF:   You have problems with the medicines you were prescribed.  Your medicines do not work.  You have a change from the usual headache.  You have nausea or vomiting. SEEK IMMEDIATE MEDICAL CARE IF:   Your headache becomes severe.  You have a fever.  You have a stiff neck.  You have loss of vision.  You have muscular weakness or loss  of muscle control.  You lose your balance or have trouble walking.  You feel faint or pass out.  You have severe symptoms that are different from your first symptoms. MAKE SURE YOU:   Understand these instructions.  Will watch your condition.  Will get help right away if you are not doing well or get worse. Document Released: 08/24/2005 Document Revised: 11/16/2011 Document Reviewed: 08/14/2011 [facility] Patient Information 2015 [place], [place]. This information is not intended to replace advice given to you by your health care provider. Make sure you discuss any questions you have with your health care provider.

## 2014-04-03 NOTE — ED Provider Notes (Signed)
Medical screening examination/treatment/procedure(s) were performed by resident physician or non-physician practitioner and as supervising physician I was immediately available for consultation/collaboration.   Pauline Good MD.   Billy Fischer, MD 04/03/14 [phone]

## 2014-04-03 NOTE — ED Provider Notes (Signed)
CSN: [id number]     Arrival date & time 04/03/14  1049 History   First MD Initiated Contact with Patient 04/03/14 1113     Chief Complaint  Patient presents with  . Headache   (Consider location/radiation/quality/duration/timing/severity/associated sxs/prior Treatment) HPI Comments: C/O frontal headache since 6 d ago. Day 2 and 3 the worst. Felt well yesterday and the day before. Headache returned last night.   Awakens her in the night and rates it 10/10. Will improve over time without medicinal tx. 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 Since surgery in 1995 has had no more migraines.  Associated sx's nausea and vomiting. Fatigue, weakness and sleepiness. Denies problems with speech, hearing, swallowing or vision. No focal paresthesias or motor weakness.  Now alert with mild dull headache. Talkative, alert, active, cooperative with lucid speech. No distress. She works in a hot environment in Teacher, adult education.    Past Medical History  Diagnosis Date  . Smoker   . Migraine headache   . Trigeminal neuralgia   . Hemorrhoids   . Personal history of colonic adenoma 07/24/2008   Past Surgical History  Procedure Laterality Date  . Knee arthroscopy    . Brain surgery      Trigeminal nerve surgery  . Tubal ligation     Family History  Problem Relation Age of Onset  . Diabetes Mother   . Hyperlipidemia Mother   . Hypertension Mother   . Heart disease Father 67    MI   History  Substance Use Topics  . Smoking status: Current Every Day Smoker -- 1.00 packs/day for 20 years    Types: Cigarettes  . Smokeless tobacco: Never Used  . Alcohol Use: No   OB History   Grav Para Term Preterm Abortions TAB SAB Ect Mult Living                 Review of Systems  Constitutional: Positive for activity change, appetite change and fatigue. Negative for fever.  Eyes: Negative for pain, discharge, redness and visual disturbance.  Respiratory: Negative for cough, choking and  shortness of breath.   Cardiovascular: Negative for chest pain, palpitations and leg swelling.  Gastrointestinal: Positive for nausea and vomiting.  Genitourinary: Negative.   Musculoskeletal: Negative.   Skin: Negative.   Neurological: Positive for headaches. Negative for dizziness, tremors, seizures, syncope, facial asymmetry, speech difficulty, weakness, light-headedness and numbness.  Psychiatric/Behavioral: Negative.     Allergies  Review of patient's allergies indicates no known allergies.  Home Medications   Prior to Admission medications   Medication Sig Start Date End Date Taking? Authorizing Provider  isometheptene-acetaminophen-dichloralphenazone (MIDRIN) 65-325-100 MG capsule Take 1 capsule by mouth 4 (four) times daily as needed for migraine. Maximum 5 capsules in 12 hours for migraine headaches, 8 capsules in 24 hours for tension headaches. 04/03/14   Janne Napoleon, NP  Multiple Vitamins-Minerals (MULTIVITAMIN WITH MINERALS) tablet Take 1 tablet by mouth daily.      Historical Provider, MD  naproxen (NAPROSYN) 375 MG tablet Take 1 tablet (375 mg total) by mouth 2 (two) times daily. 04/03/14   Janne Napoleon, NP  ondansetron (ZOFRAN) 4 MG tablet Take 1 tablet (4 mg total) by mouth every 6 (six) hours. 04/03/14   Janne Napoleon, NP   BP 121/80  Pulse 72  Temp(Src) 98.5 F (36.9 C) (Oral)  Resp 18  SpO2 95% Physical Exam  Nursing note and vitals reviewed. Constitutional: She is oriented to person, place, and time. She appears well-developed and  well-nourished. No distress.  HENT:  Mouth/Throat: Oropharynx is clear and moist. No oropharyngeal exudate.  Soft palate rises symmetrically. Tongue midline with nl movement.  Eyes: Conjunctivae and EOM are normal. Pupils are equal, round, and reactive to light.  Neck: Normal range of motion. Neck supple.  Mild pain in R splenius capitus.   Cardiovascular: Normal rate, regular rhythm, normal heart sounds and intact distal pulses.    Pulmonary/Chest: Effort normal. No respiratory distress. She has no rales.  Faint expiratory wheezes bilat.  Musculoskeletal: She exhibits no edema.  Lymphadenopathy:    She has no cervical adenopathy.  Neurological: She is alert and oriented to person, place, and time. She has normal strength and normal reflexes. No cranial nerve deficit or sensory deficit. She exhibits normal muscle tone. She displays a negative Romberg sign. Coordination and gait normal. GCS eye subscore is 4. GCS verbal subscore is 5. GCS motor subscore is 6.  Nose to finger coordinated well.   Skin: Skin is warm and dry.  Psychiatric: She has a normal mood and affect.    ED Course  Procedures (including critical care time) Labs Review Labs Reviewed  POCT I-STAT, CHEM 8 - Abnormal; Notable for the following:    Hemoglobin 17.3 (*)    HCT 51.0 (*)    All other components within normal limits    Imaging Review No results found.   MDM   1. Mixed migraine and muscle contraction headache   2. Other fatigue     zofran po now and Rx for home toradol 60 mg IM and Decadron 10 mg IM now  midrin as dir Naprosyn. Plenty of fluids Call your PCP for appt to see soon. Stop smoking      Janne Napoleon, NP 04/03/14 1209

## 2014-04-03 NOTE — ED Notes (Signed)
Decadron on back order, david, np notified.  Order changed

## 2014-04-03 NOTE — ED Notes (Signed)
Reports 2 nights in the course of this past week where she has woke form sleep, has a severe headache, vomits, and is left drained.  Touches forehead as location of head pain

## 2014-04-17 ENCOUNTER — Telehealth: Payer: Self-pay | Admitting: Family Medicine

## 2014-04-17 NOTE — Telephone Encounter (Signed)
ER letter sent 

## 2014-07-30 ENCOUNTER — Other Ambulatory Visit: Payer: Self-pay

## 2014-07-30 DIAGNOSIS — Z1231 Encounter for screening mammogram for malignant neoplasm of breast: Secondary | ICD-10-CM

## 2014-09-13 ENCOUNTER — Ambulatory Visit (INDEPENDENT_AMBULATORY_CARE_PROVIDER_SITE_OTHER): Payer: 59 | Admitting: Family Medicine

## 2014-09-13 ENCOUNTER — Other Ambulatory Visit (HOSPITAL_COMMUNITY)
Admission: RE | Admit: 2014-09-13 | Discharge: 2014-09-13 | Disposition: A | Discharge status: Home / Self Care | Payer: 59 | Source: Ambulatory Visit | Attending: Family Medicine | Admitting: Family Medicine

## 2014-09-13 ENCOUNTER — Ambulatory Visit
Admission: RE | Admit: 2014-09-13 | Discharge: 2014-09-13 | Disposition: A | Discharge status: Home / Self Care | Payer: 59 | Source: Ambulatory Visit

## 2014-09-13 ENCOUNTER — Encounter: Payer: Self-pay | Admitting: Family Medicine

## 2014-09-13 VITALS — BP 114/64 | HR 80 | Ht 64.0 in | Wt 151.0 lb

## 2014-09-13 DIAGNOSIS — Z1231 Encounter for screening mammogram for malignant neoplasm of breast: Secondary | ICD-10-CM

## 2014-09-13 DIAGNOSIS — Z0000 Encounter for general adult medical examination without abnormal findings: Secondary | ICD-10-CM

## 2014-09-13 DIAGNOSIS — Z01419 Encounter for gynecological examination (general) (routine) without abnormal findings: Secondary | ICD-10-CM | POA: Insufficient documentation

## 2014-09-13 DIAGNOSIS — Z803 Family history of malignant neoplasm of breast: Secondary | ICD-10-CM

## 2014-09-13 DIAGNOSIS — Z86010 Personal history of colonic polyps: Secondary | ICD-10-CM

## 2014-09-13 DIAGNOSIS — Z8669 Personal history of other diseases of the nervous system and sense organs: Secondary | ICD-10-CM

## 2014-09-13 LAB — COMPREHENSIVE METABOLIC PANEL
ALBUMIN: 4.3 g/dL (ref 3.5–5.2)
ALT: 27 U/L (ref 0–35)
AST: 21 U/L (ref 0–37)
Alkaline Phosphatase: 50 U/L (ref 39–117)
BUN: 14 mg/dL (ref 6–23)
CALCIUM: 9.5 mg/dL (ref 8.4–10.5)
CHLORIDE: 103 meq/L (ref 96–112)
CO2: 27 mEq/L (ref 19–32)
Creat: 0.81 mg/dL (ref 0.50–1.10)
Glucose, Bld: 85 mg/dL (ref 70–99)
Potassium: 4.5 mEq/L (ref 3.5–5.3)
Sodium: 139 mEq/L (ref 135–145)
Total Bilirubin: 0.6 mg/dL (ref 0.2–1.2)
Total Protein: 6.8 g/dL (ref 6.0–8.3)

## 2014-09-13 LAB — CBC WITH DIFFERENTIAL/PLATELET
Basophils Absolute: 0 10*3/uL (ref 0.0–0.1)
Basophils Relative: 0 % (ref 0–1)
Eosinophils Absolute: 0.3 10*3/uL (ref 0.0–0.7)
Eosinophils Relative: 3 % (ref 0–5)
HCT: 45.5 % (ref 36.0–46.0)
Hemoglobin: 15.9 g/dL — ABNORMAL HIGH (ref 12.0–15.0)
LYMPHS ABS: 2.5 10*3/uL (ref 0.7–4.0)
LYMPHS PCT: 29 % (ref 12–46)
MCH: 30.9 pg (ref 26.0–34.0)
MCHC: 34.9 g/dL (ref 30.0–36.0)
MCV: 88.5 fL (ref 78.0–100.0)
MONO ABS: 0.7 10*3/uL (ref 0.1–1.0)
MONOS PCT: 8 % (ref 3–12)
MPV: 9.8 fL (ref 8.6–12.4)
NEUTROS PCT: 60 % (ref 43–77)
Neutro Abs: 5.2 10*3/uL (ref 1.7–7.7)
PLATELETS: 285 10*3/uL (ref 150–400)
RBC: 5.14 MIL/uL — ABNORMAL HIGH (ref 3.87–5.11)
RDW: 13.7 % (ref 11.5–15.5)
WBC: 8.7 10*3/uL (ref 4.0–10.5)

## 2014-09-13 LAB — LIPID PANEL
CHOLESTEROL: 221 mg/dL — AB (ref 0–200)
HDL: 63 mg/dL (ref >39)
LDL Cholesterol: 135 mg/dL — ABNORMAL HIGH (ref 0–99)
Total CHOL/HDL Ratio: 3.5 Ratio
Triglycerides: 116 mg/dL (ref <150)
VLDL: 23 mg/dL (ref 0–40)

## 2014-09-13 NOTE — Progress Notes (Signed)
   Subjective:    Patient ID: [name] [name], female    DOB: [birth year], 59 y.o.   MRN: [id number]  HPI She is here for a complete examination. She does have an underlying history of migraine headaches and apparently had a surgical procedure to help with the trigeminal nerve. She rarely has difficulty. She did go to an urgent care center within the last year but in general has done well. She did quit smoking approximately 6 months ago. She gets routine mammograms. She also has a history of adenomatous polyp and had a colonoscopy in 2009. Social and family history were reviewed. Work is going well. She has no other concerns or complaints.   Review of Systems  All other systems reviewed and are negative.      Objective:   Physical Exam BP 114/64 mmHg  Pulse 80  Ht 5\' 4"  (1.626 m)  Wt 151 lb (68.493 kg)  BMI 25.91 kg/m2  General Appearance:    Alert, cooperative, no distress, appears stated age  Head:    Normocephalic, without obvious abnormality, atraumatic  Eyes    PERRL, conjunctiva/corneas clear, EOM's intact, fundi    benign  Ears:    Normal TM's and external ear canals  Nose:   Nares normal, mucosa normal, no drainage or sinus   tenderness  Throat:   Lips, mucosa, and tongue normal; teeth and gums normal  Neck:   Supple, no lymphadenopathy;  thyroid:  no   enlargement/tenderness/nodules; no carotid   bruit or JVD  Back:    Spine nontender, no curvature, ROM normal, no CVA     tenderness  Lungs:     Clear to auscultation bilaterally without wheezes, rales or     ronchi; respirations unlabored  Chest Wall:    No tenderness or deformity   Heart:    Regular rate and rhythm, S1 and S2 normal, no murmur, rub   or gallop  Breast Exam:    No tenderness, masses, or nipple discharge or inversion.      No axillary lymphadenopathy  Abdomen:     Soft, non-tender, nondistended, normoactive bowel sounds,    no masses, no hepatosplenomegaly  Genitalia:    Normal external genitalia without  lesions.  BUS and vagina normal; cervix without lesions, or cervical motion tenderness. No abnormal vaginal discharge.  Uterus and adnexa not enlarged, nontender, no masses.  Pap performed  Rectal:    Not performed due to age<40 and no related complaints  Extremities:   No clubbing, cyanosis or edema  Pulses:   2+ and symmetric all extremities  Skin:   Skin color, texture, turgor normal, no rashes or lesions  Lymph nodes:   Cervical, supraclavicular, and axillary nodes normal  Neurologic:   CNII-XII intact, normal strength, sensation and gait; reflexes 2+ and symmetric throughout          Psych:   Normal mood, affect, hygiene and grooming.          Assessment & Plan:  Routine general medical examination at a health care facility - Plan: CBC with Differential, Comprehensive metabolic panel, Lipid panel, Cytology - PAP [place]  Family history of breast cancer in mother  History of colonic polyps - Plan: Ambulatory referral to Gastroenterology  History of migraine headaches Encouraged her to continue to take good care of herself.

## 2014-09-18 LAB — CYTOLOGY - PAP

## 2014-12-10 ENCOUNTER — Encounter: Payer: Self-pay | Admitting: Internal Medicine

## 2014-12-20 ENCOUNTER — Ambulatory Visit (AMBULATORY_SURGERY_CENTER): Payer: Self-pay

## 2014-12-20 VITALS — Ht 64.0 in | Wt 154.0 lb

## 2014-12-20 DIAGNOSIS — Z86010 Personal history of colonic polyps: Secondary | ICD-10-CM

## 2014-12-20 MED ORDER — POLYETHYLENE GLYCOL 3350 17 GM/SCOOP PO POWD: ORAL | Status: DC

## 2014-12-20 NOTE — Progress Notes (Signed)
Per pt, no allergies to soy or egg products.Pt not taking any weight loss meds or using  O2 at home.   Per pt, she is hard to wake up past some sedation!

## 2015-01-01 ENCOUNTER — Encounter: Payer: Self-pay | Admitting: Internal Medicine

## 2015-01-01 ENCOUNTER — Ambulatory Visit (AMBULATORY_SURGERY_CENTER): Payer: 59 | Admitting: Internal Medicine

## 2015-01-01 VITALS — BP 121/79 | HR 63 | Temp 97.4°F | Resp 22 | Ht 64.0 in | Wt 151.0 lb

## 2015-01-01 DIAGNOSIS — Z86010 Personal history of colonic polyps: Secondary | ICD-10-CM | POA: Diagnosis not present

## 2015-01-01 DIAGNOSIS — D128 Benign neoplasm of rectum: Secondary | ICD-10-CM

## 2015-01-01 DIAGNOSIS — K621 Rectal polyp: Secondary | ICD-10-CM

## 2015-01-01 MED ORDER — SODIUM CHLORIDE 0.9 % IV SOLN: 500.0000 mL | INTRAVENOUS | Status: DC

## 2015-01-01 MED ORDER — FLEET ENEMA 7-19 GM/118ML RE ENEM
1.0000 | ENEMA | Freq: Once | RECTAL | Status: AC
  Administered 2015-01-01: 1 via RECTAL

## 2015-01-01 NOTE — Patient Instructions (Addendum)
I found and removed one small polyp that looks benign. I will let you know pathology results and when to have another routine colonoscopy by mail.  I appreciate the opportunity to care for you. Gatha Mayer, MD, FACG  YOU HAD AN ENDOSCOPIC PROCEDURE TODAY AT [facility]:   Refer to the procedure report that was given to you for any specific questions about what was found during the examination.  If the procedure report does not answer your questions, please call your gastroenterologist to clarify.  If you requested that your care partner not be given the details of your procedure findings, then the procedure report has been included in a sealed envelope for you to review at your convenience later.  YOU SHOULD EXPECT: Some feelings of bloating in the abdomen. Passage of more gas than usual.  Walking can help get rid of the air that was put into your GI tract during the procedure and reduce the bloating. If you had a lower endoscopy (such as a colonoscopy or flexible sigmoidoscopy) you may notice spotting of blood in your stool or on the toilet paper. If you underwent a bowel prep for your procedure, you may not have a normal bowel movement for a few days.  Please Note:  You might notice some irritation and congestion in your nose or some drainage.  This is from the oxygen used during your procedure.  There is no need for concern and it should clear up in a day or so.  SYMPTOMS TO REPORT IMMEDIATELY:   Following lower endoscopy (colonoscopy or flexible sigmoidoscopy):  Excessive amounts of blood in the stool  Significant tenderness or worsening of abdominal pains  Swelling of the abdomen that is new, acute  Fever of 100F or higher For urgent or emergent issues, a gastroenterologist can be reached at any hour by calling [phone].  DIET: Your first meal following the procedure should be a small meal and then it is ok to progress to your normal diet. Heavy or  fried foods are harder to digest and may make you feel nauseous or bloated.  Likewise, meals heavy in dairy and vegetables can increase bloating.  Drink plenty of fluids but you should avoid alcoholic beverages for 24 hours.  ACTIVITY:  You should plan to take it easy for the rest of today and you should NOT DRIVE or use heavy machinery until tomorrow (because of the sedation medicines used during the test).    FOLLOW UP: Our staff will call the number listed on your records the next business day following your procedure to check on you and address any questions or concerns that you may have regarding the information given to you following your procedure. If we do not reach you, we will leave a message.  However, if you are feeling well and you are not experiencing any problems, there is no need to return our call.  We will assume that you have returned to your regular daily activities without incident.  If any biopsies were taken you will be contacted by phone or by letter within the next 1-3 weeks.  Please call us at [phone] if you have not heard about the biopsies in 3 weeks.   SIGNATURES/CONFIDENTIALITY: You and/or your care partner have signed paperwork which will be entered into your electronic medical record.  These signatures attest to the fact that that the information above on your After Visit Summary has been reviewed and is understood.  Full responsibility  of the confidentiality of this discharge information lies with you and/or your care-partner.  Continue your normal medications  Please read over handout about polyps

## 2015-01-01 NOTE — Op Note (Signed)
[place]  [employer]. [place], [address]   COLONOSCOPY PROCEDURE REPORT  PATIENT: [name] [name], [name] [name]  MR#: [id number] BIRTHDATE: [birth year] , 48  yrs. old GENDER: female ENDOSCOPIST: Gatha Mayer, MD, [facility] PROCEDURE DATE:  01/01/2015 PROCEDURE:   Colonoscopy with biopsy First Screening Colonoscopy - Avg.  risk and is 50 yrs.  old or older - No.  Prior Negative Screening - Now for repeat screening. N/A  History of Adenoma - Now for follow-up colonoscopy & has been > or = to 3 yrs.  Yes hx of adenoma.  Has been 3 or more years since last colonoscopy. ASA CLASS:   Class I INDICATIONS:Surveillance due to prior colonic neoplasia and PH Colon Adenoma. MEDICATIONS: Propofol 310 mg IV and Monitored anesthesia care  DESCRIPTION OF PROCEDURE:   After the risks benefits and alternatives of the procedure were thoroughly explained, informed consent was obtained.  The digital rectal exam revealed no abnormalities of the rectum.   The LB [id number]  endoscope was introduced through the anus and advanced to the cecum, which was identified by both the appendix and ileocecal valve. No adverse events experienced.   The quality of the prep was adequate (MiraLax was used)  The instrument was then slowly withdrawn as the colon was fully examined.      COLON FINDINGS: A sessile polyp measuring 4 mm in size was found in the rectum.  A polypectomy was performed with cold forceps.  The resection was complete, the polyp tissue was completely retrieved and sent to histology.   The examination was otherwise normal. Retroflexed views revealed no abnormalities. The time to cecum = 7.6 Withdrawal time = 15.2   The scope was withdrawn and the procedure completed. COMPLICATIONS: There were no immediate complications.  ENDOSCOPIC IMPRESSION: 1.   Sessile polyp was found in the rectum; polypectomy was performed with cold forceps 2.   The examination was otherwise normal -  adequate prep  RECOMMENDATIONS: Timing of repeat colonoscopy will be determined by pathology findings. Hx small adenoma 2009  eSigned:  Gatha Mayer, MD, [facility] 01/01/2015 4:34 PM   cc: The Patient

## 2015-01-01 NOTE — Progress Notes (Signed)
Called to room to assist during endoscopic procedure.  Patient ID and intended procedure confirmed with present staff. Received instructions for my participation in the procedure from the performing physician.  

## 2015-01-01 NOTE — Progress Notes (Signed)
To recovery, report to Randall Hiss, RN. VSS

## 2015-01-02 ENCOUNTER — Telehealth: Payer: Self-pay | Admitting: *Deleted

## 2015-01-02 NOTE — Telephone Encounter (Signed)
Number identifier, left message, follow-up  

## 2015-01-07 ENCOUNTER — Encounter: Payer: Self-pay | Admitting: Internal Medicine

## 2015-01-07 DIAGNOSIS — Z86010 Personal history of colonic polyps: Secondary | ICD-10-CM

## 2015-01-07 NOTE — Progress Notes (Signed)
Quick Note:  Distal hyperplastic polyp - repeat colonoscopy 2026 - hx adenoma (small) 2009 ______

## 2015-07-23 ENCOUNTER — Encounter: Payer: Self-pay | Admitting: Internal Medicine

## 2015-09-16 ENCOUNTER — Other Ambulatory Visit: Payer: Self-pay

## 2015-09-16 DIAGNOSIS — Z1231 Encounter for screening mammogram for malignant neoplasm of breast: Secondary | ICD-10-CM

## 2015-10-14 ENCOUNTER — Encounter: Payer: Self-pay | Admitting: Family Medicine

## 2015-10-14 ENCOUNTER — Ambulatory Visit (INDEPENDENT_AMBULATORY_CARE_PROVIDER_SITE_OTHER): Payer: Commercial Managed Care - HMO | Admitting: Family Medicine

## 2015-10-14 ENCOUNTER — Ambulatory Visit
Admission: RE | Admit: 2015-10-14 | Discharge: 2015-10-14 | Disposition: A | Discharge status: Home / Self Care | Payer: Commercial Managed Care - HMO | Source: Ambulatory Visit

## 2015-10-14 VITALS — BP 118/80 | HR 63 | Ht 63.75 in | Wt 140.1 lb

## 2015-10-14 DIAGNOSIS — Z803 Family history of malignant neoplasm of breast: Secondary | ICD-10-CM

## 2015-10-14 DIAGNOSIS — Z1159 Encounter for screening for other viral diseases: Secondary | ICD-10-CM | POA: Diagnosis not present

## 2015-10-14 DIAGNOSIS — Z0000 Encounter for general adult medical examination without abnormal findings: Secondary | ICD-10-CM

## 2015-10-14 DIAGNOSIS — Z8669 Personal history of other diseases of the nervous system and sense organs: Secondary | ICD-10-CM | POA: Diagnosis not present

## 2015-10-14 DIAGNOSIS — Z86010 Personal history of colonic polyps: Secondary | ICD-10-CM

## 2015-10-14 DIAGNOSIS — Z1231 Encounter for screening mammogram for malignant neoplasm of breast: Secondary | ICD-10-CM

## 2015-10-14 DIAGNOSIS — Z87891 Personal history of nicotine dependence: Secondary | ICD-10-CM

## 2015-10-14 LAB — CBC WITH DIFFERENTIAL/PLATELET
BASOS PCT: 0 % (ref 0–1)
Basophils Absolute: 0 10*3/uL (ref 0.0–0.1)
EOS ABS: 0.2 10*3/uL (ref 0.0–0.7)
Eosinophils Relative: 2 % (ref 0–5)
HCT: 45.7 % (ref 36.0–46.0)
HEMOGLOBIN: 15.3 g/dL — AB (ref 12.0–15.0)
Lymphocytes Relative: 27 % (ref 12–46)
Lymphs Abs: 2.7 10*3/uL (ref 0.7–4.0)
MCH: 30.5 pg (ref 26.0–34.0)
MCHC: 33.5 g/dL (ref 30.0–36.0)
MCV: 91 fL (ref 78.0–100.0)
MONO ABS: 0.6 10*3/uL (ref 0.1–1.0)
MONOS PCT: 6 % (ref 3–12)
MPV: 10.4 fL (ref 8.6–12.4)
NEUTROS ABS: 6.6 10*3/uL (ref 1.7–7.7)
NEUTROS PCT: 65 % (ref 43–77)
Platelets: 302 10*3/uL (ref 150–400)
RBC: 5.02 MIL/uL (ref 3.87–5.11)
RDW: 14.1 % (ref 11.5–15.5)
WBC: 10.1 10*3/uL (ref 4.0–10.5)

## 2015-10-14 LAB — COMPREHENSIVE METABOLIC PANEL
ALT: 23 U/L (ref 6–29)
AST: 25 U/L (ref 10–35)
Albumin: 4.2 g/dL (ref 3.6–5.1)
Alkaline Phosphatase: 47 U/L (ref 33–130)
BUN: 12 mg/dL (ref 7–25)
CO2: 28 mmol/L (ref 20–31)
CREATININE: 0.74 mg/dL (ref 0.50–1.05)
Calcium: 9.5 mg/dL (ref 8.6–10.4)
Chloride: 106 mmol/L (ref 98–110)
Glucose, Bld: 81 mg/dL (ref 65–99)
Potassium: 5 mmol/L (ref 3.5–5.3)
SODIUM: 143 mmol/L (ref 135–146)
TOTAL PROTEIN: 6.5 g/dL (ref 6.1–8.1)
Total Bilirubin: 0.5 mg/dL (ref 0.2–1.2)

## 2015-10-14 LAB — LIPID PANEL
CHOLESTEROL: 168 mg/dL (ref 125–200)
HDL: 69 mg/dL (ref >=46)
LDL CALC: 87 mg/dL (ref <130)
Total CHOL/HDL Ratio: 2.4 Ratio (ref <=5.0)
Triglycerides: 61 mg/dL (ref <150)
VLDL: 12 mg/dL (ref <30)

## 2015-10-14 NOTE — Progress Notes (Signed)
   Subjective:    Patient ID: [name] [name], female    DOB: 1956/03/15, 60 y.o.   MRN: [id number]  HPI He is here for complete examination. She has been under a lot of stress recently due to her son had a left BKA due to occlusion. He is now home and started to recuperate. She has a history of colonic polyps however review indicates that it was hyperplastic. She is scheduled for repeat in 10 years. Her mother did have breast cancer. She does get regular mammograms. She did quit smoking. She also has a history of migraine headaches due to surgical intervention for treatment of trigeminal neurology. Family and social history as well as health maintenance and immunizations was reviewed. She will get a flu shot through her pharmacy. She has no other concerns or complaints.   Review of Systems  All other systems reviewed and are negative.      Objective:   Physical Exam BP 118/80 mmHg  Pulse 63  Ht 5' 3.75" (1.619 m)  Wt 140 lb 2 oz (63.56 kg)  BMI 24.25 kg/m2  SpO2 98%  General Appearance:    Alert, cooperative, no distress, appears stated age  Head:    Normocephalic, without obvious abnormality, atraumatic  Eyes:    PERRL, conjunctiva/corneas clear, EOM's intact, fundi    benign  Ears:    Normal TM's and external ear canals  Nose:   Nares normal, mucosa normal, no drainage or sinus   tenderness  Throat:   Lips, mucosa, and tongue normal; teeth and gums normal  Neck:   Supple, no lymphadenopathy;  thyroid:  no   enlargement/tenderness/nodules; no carotid   bruit or JVD  Back:    Spine nontender, no curvature, ROM normal, no CVA     tenderness  Lungs:     Clear to auscultation bilaterally without wheezes, rales or     ronchi; respirations unlabored  Chest Wall:    No tenderness or deformity   Heart:    Regular rate and rhythm, S1 and S2 normal, no murmur, rub   or gallop  Breast Exam:    Deferred to GYN  Abdomen:     Soft, non-tender, nondistended, normoactive bowel sounds,    no  masses, no hepatosplenomegaly  Genitalia:    Deferred to GYN     Extremities:   No clubbing, cyanosis or edema  Pulses:   2+ and symmetric all extremities  Skin:   Skin color, texture, turgor normal, no rashes or lesions  Lymph nodes:   Cervical, supraclavicular, and axillary nodes normal  Neurologic:   CNII-XII intact, normal strength, sensation and gait; reflexes 2+ and symmetric throughout          Psych:   Normal mood, affect, hygiene and grooming.          Assessment & Plan:  Routine general medical examination at a health care facility - Plan: CBC with Differential/Platelet, Comprehensive metabolic panel, Lipid panel  History of colonic polyps  Family history of breast cancer in mother  Former smoker  History of migraine headaches - History of trigeminal neuralgia and surgery for that in 1995 in [place]  Need for hepatitis C screening test - Plan: Hepatitis C antibody Overall she seems to doing quite well and I encouraged her to continue to take good care of herself.

## 2015-10-15 ENCOUNTER — Telehealth: Payer: Self-pay | Admitting: Family Medicine

## 2015-10-15 LAB — HEPATITIS C ANTIBODY: HCV AB: NEGATIVE

## 2015-10-15 NOTE — Progress Notes (Signed)
Left VM about blood work looks good

## 2015-10-15 NOTE — Telephone Encounter (Signed)
Pt called back & I advised [place] screening form completed & this was faxed per her request

## 2015-12-02 ENCOUNTER — Ambulatory Visit (INDEPENDENT_AMBULATORY_CARE_PROVIDER_SITE_OTHER): Payer: Commercial Managed Care - HMO | Admitting: Family Medicine

## 2015-12-02 ENCOUNTER — Encounter: Payer: Self-pay | Admitting: Family Medicine

## 2015-12-02 VITALS — BP 110/72 | HR 87 | Temp 98.1°F | Ht 63.75 in | Wt 134.0 lb

## 2015-12-02 DIAGNOSIS — Z87891 Personal history of nicotine dependence: Secondary | ICD-10-CM

## 2015-12-02 DIAGNOSIS — J209 Acute bronchitis, unspecified: Secondary | ICD-10-CM

## 2015-12-02 DIAGNOSIS — J301 Allergic rhinitis due to pollen: Secondary | ICD-10-CM

## 2015-12-02 DIAGNOSIS — Z6379 Other stressful life events affecting family and household: Secondary | ICD-10-CM

## 2015-12-02 MED ORDER — AMOXICILLIN 875 MG PO TABS: 875.0000 mg | ORAL_TABLET | Freq: Two times a day (BID) | ORAL | Status: DC

## 2015-12-02 NOTE — Progress Notes (Signed)
   Subjective:    Patient ID: [name] [name], female    DOB: 08/05/1956, 60 y.o.   MRN: [id number]  HPI She has been having difficulty over the last 2 weeks with sneezing, nasal congestion, rhinorrhea and slight headache with PND,itchy watery eyes. She has been using Zyrtecor treatment of her seasonal allergies. This stopped working several days ago with worsening of headache, earache, productive cough, chills and fatigue. She is a former smoker. She has also been under last stress taking care of her son who recently lost a leg due to vascular complications. Has her quite stressed.   Review of Systems     Objective:   Physical Exam Alert and in no distress. Tympanic membranes and canals are normal. Pharyngeal area is normal. Neck is supple without adenopathy or thyromegaly. Cardiac exam shows a regular sinus rhythm without murmurs or gallops. Lungs show scattered rhonchi.        Assessment & Plan:  Acute bronchitis, unspecified organism - Plan: amoxicillin (AMOXIL) 875 MG tablet  Allergic rhinitis due to pollen  Former smoker  Stress due to illness of family member he is to continue on Zyrtec. She will call in 10 days and not totally back to normal. Also counseled her concerning taking care of her son. At this time she seems to be doing a good job. Strongly encouraged her to keep in touch with her jeans concerning the amount of pain he is having. He apparently is also having phantom pain.

## 2015-12-02 NOTE — Patient Instructions (Signed)
If  not back to normal in 7-10 days call me

## 2016-10-14 ENCOUNTER — Encounter: Payer: Self-pay | Admitting: Family Medicine

## 2016-10-14 ENCOUNTER — Ambulatory Visit
Admission: RE | Admit: 2016-10-14 | Discharge: 2016-10-14 | Disposition: A | Discharge status: Home / Self Care | Payer: Commercial Managed Care - HMO | Source: Ambulatory Visit | Attending: Family Medicine | Admitting: Family Medicine

## 2016-10-14 ENCOUNTER — Ambulatory Visit (INDEPENDENT_AMBULATORY_CARE_PROVIDER_SITE_OTHER): Payer: Commercial Managed Care - HMO | Admitting: Family Medicine

## 2016-10-14 ENCOUNTER — Other Ambulatory Visit: Payer: Self-pay | Admitting: Family Medicine

## 2016-10-14 VITALS — BP 112/70 | HR 78 | Resp 12 | Ht 64.0 in | Wt 136.0 lb

## 2016-10-14 DIAGNOSIS — Z860100 Personal history of colon polyps, unspecified: Secondary | ICD-10-CM

## 2016-10-14 DIAGNOSIS — Z1231 Encounter for screening mammogram for malignant neoplasm of breast: Secondary | ICD-10-CM | POA: Diagnosis not present

## 2016-10-14 DIAGNOSIS — Z803 Family history of malignant neoplasm of breast: Secondary | ICD-10-CM

## 2016-10-14 DIAGNOSIS — Z0000 Encounter for general adult medical examination without abnormal findings: Secondary | ICD-10-CM

## 2016-10-14 DIAGNOSIS — Z87891 Personal history of nicotine dependence: Secondary | ICD-10-CM

## 2016-10-14 DIAGNOSIS — Z8669 Personal history of other diseases of the nervous system and sense organs: Secondary | ICD-10-CM | POA: Diagnosis not present

## 2016-10-14 DIAGNOSIS — Z6379 Other stressful life events affecting family and household: Secondary | ICD-10-CM

## 2016-10-14 LAB — LIPID PANEL
CHOL/HDL RATIO: 2.8 ratio (ref <5.0)
Cholesterol: 194 mg/dL (ref <200)
HDL: 70 mg/dL (ref >50)
LDL CALC: 105 mg/dL — AB (ref <100)
TRIGLYCERIDES: 93 mg/dL (ref <150)
VLDL: 19 mg/dL (ref <30)

## 2016-10-14 LAB — COMPREHENSIVE METABOLIC PANEL
ALBUMIN: 4.4 g/dL (ref 3.6–5.1)
ALT: 30 U/L — ABNORMAL HIGH (ref 6–29)
AST: 26 U/L (ref 10–35)
Alkaline Phosphatase: 50 U/L (ref 33–130)
BUN: 15 mg/dL (ref 7–25)
CALCIUM: 9.6 mg/dL (ref 8.6–10.4)
CHLORIDE: 106 mmol/L (ref 98–110)
CO2: 25 mmol/L (ref 20–31)
Creat: 0.87 mg/dL (ref 0.50–1.05)
GLUCOSE: 87 mg/dL (ref 65–99)
Potassium: 4.5 mmol/L (ref 3.5–5.3)
Sodium: 142 mmol/L (ref 135–146)
Total Bilirubin: 0.7 mg/dL (ref 0.2–1.2)
Total Protein: 6.9 g/dL (ref 6.1–8.1)

## 2016-10-14 LAB — CBC WITH DIFFERENTIAL/PLATELET
BASOS ABS: 83 {cells}/uL (ref 0–200)
Basophils Relative: 1 %
Eosinophils Absolute: 332 cells/uL (ref 15–500)
Eosinophils Relative: 4 %
HEMATOCRIT: 46.2 % — AB (ref 35.0–45.0)
HEMOGLOBIN: 15.7 g/dL — AB (ref 11.7–15.5)
LYMPHS ABS: 2739 {cells}/uL (ref 850–3900)
Lymphocytes Relative: 33 %
MCH: 31 pg (ref 27.0–33.0)
MCHC: 34 g/dL (ref 32.0–36.0)
MCV: 91.3 fL (ref 80.0–100.0)
MONO ABS: 498 {cells}/uL (ref 200–950)
MPV: 10.5 fL (ref 7.5–12.5)
Monocytes Relative: 6 %
Neutro Abs: 4648 cells/uL (ref 1500–7800)
Neutrophils Relative %: 56 %
Platelets: 301 10*3/uL (ref 140–400)
RBC: 5.06 MIL/uL (ref 3.80–5.10)
RDW: 13.8 % (ref 11.0–15.0)
WBC: 8.3 10*3/uL (ref 4.0–10.5)

## 2016-10-14 NOTE — Progress Notes (Signed)
   Subjective:    Patient ID: [name] [name], female    DOB: Oct 12, 1955, 61 y.o.   MRN: [id number]  HPI She is here for complete examination. She is now living with her son who has had a very stormy last several months. He is now finally recovering from an AKA. The stump is almost completely healed. She moved in with him to help care for him. She does have a history of allergies but having no difficulty with that. She has had no migraine headaches in quite some time. She is a former smoker. She is scheduled for a mammogram and will get the 3-D variety. She did get a flu test this year. She has a previous history of colonic polyps. Last colonoscopy was negative and she was told to come back in 2 years. Otherwise she has no particular concerns or complaints. Family and social history as well as health maintenance and immunizations were reviewed.   Review of Systems  All other systems reviewed and are negative.      Objective:   Physical Exam BP 112/70   Pulse 78   Resp 12   Ht 5\' 4"  (1.626 m)   Wt 136 lb (61.7 kg)   SpO2 96%   BMI 23.34 kg/m   General Appearance:    Alert, cooperative, no distress, appears stated age  Head:    Normocephalic, without obvious abnormality, atraumatic  Eyes:    PERRL, conjunctiva/corneas clear, EOM's intact, fundi    benign  Ears:    Normal TM's and external ear canals  Nose:   Nares normal, mucosa normal, no drainage or sinus   tenderness  Throat:   Lips, mucosa, and tongue normal; teeth and gums normal  Neck:   Supple, no lymphadenopathy;  thyroid:  no   enlargement/tenderness/nodules; no carotid   bruit or JVD     Lungs:     Clear to auscultation bilaterally without wheezes, rales or     ronchi; respirations unlabored      Heart:    Regular rate and rhythm, S1 and S2 normal, no murmur, rub   or gallop  Breast Exam:    Deferred to GYN  Abdomen:     Soft, non-tender, nondistended, normoactive bowel sounds,    no masses, no hepatosplenomegaly    Genitalia:    Deferred to GYN     Extremities:   No clubbing, cyanosis or edema  Pulses:   2+ and symmetric all extremities  Skin:   Skin color, texture, turgor normal, no rashes or lesions  Lymph nodes:   Cervical, supraclavicular, and axillary nodes normal  Neurologic:   CNII-XII intact, normal strength, sensation and gait; reflexes 2+ and symmetric throughout          Psych:   Normal mood, affect, hygiene and grooming.          Assessment & Plan:  Routine general medical examination at a health care facility - Plan: CBC with Differential/Platelet, Comprehensive metabolic panel, Lipid panel  Family history of breast cancer in mother  History of colonic polyps  Former smoker  History of migraine headaches  Stress due to illness of family member In general she is doing quite well. Rest the stress that she is under and she seems to be handling this quite well. I encouraged her to continue to take good care of herself.

## 2017-04-01 ENCOUNTER — Ambulatory Visit (INDEPENDENT_AMBULATORY_CARE_PROVIDER_SITE_OTHER): Payer: 59 | Admitting: Family Medicine

## 2017-04-01 ENCOUNTER — Encounter: Payer: Self-pay | Admitting: Family Medicine

## 2017-04-01 VITALS — BP 100/70 | HR 74 | Ht 64.0 in | Wt 142.0 lb

## 2017-04-01 DIAGNOSIS — D171 Benign lipomatous neoplasm of skin and subcutaneous tissue of trunk: Secondary | ICD-10-CM | POA: Diagnosis not present

## 2017-04-01 NOTE — Progress Notes (Signed)
   Subjective:    Patient ID: [name] [name], female    DOB: [birth year], 61 y.o.   MRN: [id number]  HPI She is here for evaluation of lesion on the left chest area. It sometimes interferes when she wears her bra and becomes irritated.   Review of Systems     Objective:   Physical Exam Eighth 36 cm round smooth movable lesion is noted in the left chest area.       Assessment & Plan:  Lipoma of torso - Plan: Ambulatory referral to General Surgery I explained that this is a lipoma and recommended no intervention but she would like to have this further evaluated at least consider it. I explained that they are sometimes more difficult to remove than people realize. Referral will be made.

## 2017-04-27 DIAGNOSIS — D171 Benign lipomatous neoplasm of skin and subcutaneous tissue of trunk: Secondary | ICD-10-CM | POA: Diagnosis not present

## 2017-06-03 ENCOUNTER — Telehealth: Payer: Self-pay | Admitting: Family Medicine

## 2017-06-03 NOTE — Telephone Encounter (Signed)
Surgical center called for stat copy of ov, labs, ekg

## 2017-06-04 ENCOUNTER — Other Ambulatory Visit: Payer: Self-pay | Admitting: General Surgery

## 2017-06-04 DIAGNOSIS — D171 Benign lipomatous neoplasm of skin and subcutaneous tissue of trunk: Secondary | ICD-10-CM | POA: Diagnosis not present

## 2017-06-28 ENCOUNTER — Encounter: Payer: Self-pay | Admitting: *Deleted

## 2017-10-06 ENCOUNTER — Other Ambulatory Visit: Payer: Self-pay | Admitting: Family Medicine

## 2017-10-06 DIAGNOSIS — Z1231 Encounter for screening mammogram for malignant neoplasm of breast: Secondary | ICD-10-CM

## 2017-10-22 ENCOUNTER — Encounter: Payer: Self-pay | Admitting: Medical

## 2017-10-22 ENCOUNTER — Ambulatory Visit: Payer: 59 | Admitting: Medical

## 2017-10-22 VITALS — BP 108/70 | HR 70 | Temp 98.1°F | Wt 145.8 lb

## 2017-10-22 DIAGNOSIS — R109 Unspecified abdominal pain: Secondary | ICD-10-CM

## 2017-10-22 DIAGNOSIS — B349 Viral infection, unspecified: Secondary | ICD-10-CM | POA: Diagnosis not present

## 2017-10-22 DIAGNOSIS — R112 Nausea with vomiting, unspecified: Secondary | ICD-10-CM

## 2017-10-22 LAB — POCT URINALYSIS DIP (PROADVANTAGE DEVICE)
BILIRUBIN UA: NEGATIVE
BILIRUBIN UA: NEGATIVE mg/dL
Blood, UA: NEGATIVE
GLUCOSE UA: NEGATIVE mg/dL
LEUKOCYTES UA: NEGATIVE
Nitrite, UA: NEGATIVE
PH UA: 6 (ref 5.0–8.0)
Protein Ur, POC: NEGATIVE mg/dL
Specific Gravity, Urine: 1.03
Urobilinogen, Ur: NEGATIVE

## 2017-10-22 MED ORDER — ONDANSETRON HCL 4 MG PO TABS: 4.0000 mg | ORAL_TABLET | Freq: Three times a day (TID) | ORAL | 0 refills | Status: AC | PRN

## 2017-10-22 NOTE — Progress Notes (Signed)
   Subjective:    Patient ID: [name] [name], female    DOB: [birth year], 62 y.o.   MRN: [id number]  Here for illness.  2 days ago started feeling bad, then last 2 days no appetite, nauseated, weak, loose stool/diarrhea.  Hasn't vomited but feels nauseated.  Not eating anything.   Drinking some water.  Has a little cough, some sneezing, but no sore throat, no ear pressure.  Has had some headaches.  Has had body aches, chills, fatigue.  Has been in the bed earlier than usual the last few days.   Using nothing for symptoms.  No other aggravating or relieving factors. No other complaint.    Review of Systems      Objective: BP 108/70   Pulse 70   Temp 98.1 F (36.7 C)   Wt 145 lb 12.8 oz (66.1 kg)   SpO2 97%   BMI 25.03 kg/m   Wt Readings from Last 3 Encounters:  10/22/17 145 lb 12.8 oz (66.1 kg)  04/01/17 142 lb (64.4 kg)  10/14/16 136 lb (61.7 kg)      Physical Exam  General appearance: alert, no distress, WD/WN,  HEENT: normocephalic, sclerae anicteric, TMs pearly, nares patent, no discharge or erythema, pharynx normal Oral cavity: Somewhat dry mucous membranes , no lesions Neck: supple, no lymphadenopathy, no thyromegaly, no masses Heart: RRR, normal S1, S2, no murmurs Lungs: CTA bilaterally, no wheezes, rhonchi, or rales Abdomen: +bs, soft, mild generalized lower abdominal tenderness, otherwise non tender, non distended, no masses, no hepatomegaly, no splenomegaly Back nontender Pulses: 2+ symmetric, upper and lower extremities, normal cap refill      Assessment & Plan:   Encounter Diagnoses  Name Primary?  . Viral syndrome Yes  . Stomach pain   . Nausea and vomiting, intractability of vomiting not specified, unspecified vomiting type     Discussed symptoms and exam findings suggesting viral syndrome, viral gastroenteritis.  She is somewhat dehydrated.  Discussed hydration, rest, allowing the diarrhea to run its course, can use Zofran for nausea and vomiting,  discussed the need to rehydrate.  Discussed symptoms or signs of a prompt recheck or call back over the weekend.  Follow-up as needed.  I expressed sympathy for the loss of her uncle from the funeral last week.  Romayne was seen today for stomach.  Diagnoses and all orders for this visit:  Viral syndrome  Stomach pain -     POCT Urinalysis DIP (Proadvantage Device)  Nausea and vomiting, intractability of vomiting not specified, unspecified vomiting type  Other orders -     ondansetron (ZOFRAN) 4 MG tablet; Take 1 tablet (4 mg total) by mouth every 8 (eight) hours as needed for nausea or vomiting.

## 2017-10-25 ENCOUNTER — Ambulatory Visit: Payer: Commercial Managed Care - HMO

## 2017-11-30 ENCOUNTER — Encounter: Payer: 59 | Admitting: Family Medicine

## 2017-11-30 ENCOUNTER — Ambulatory Visit
Admission: RE | Admit: 2017-11-30 | Discharge: 2017-11-30 | Disposition: A | Discharge status: Home / Self Care | Payer: 59 | Source: Ambulatory Visit | Attending: Family Medicine | Admitting: Family Medicine

## 2017-11-30 DIAGNOSIS — Z1231 Encounter for screening mammogram for malignant neoplasm of breast: Secondary | ICD-10-CM | POA: Diagnosis not present

## 2017-12-14 ENCOUNTER — Encounter: Payer: Self-pay | Admitting: Family Medicine

## 2017-12-14 ENCOUNTER — Ambulatory Visit: Payer: 59 | Admitting: Family Medicine

## 2017-12-14 ENCOUNTER — Other Ambulatory Visit (HOSPITAL_COMMUNITY)
Admission: RE | Admit: 2017-12-14 | Discharge: 2017-12-14 | Disposition: A | Discharge status: Home / Self Care | Payer: 59 | Source: Ambulatory Visit | Attending: Family Medicine | Admitting: Family Medicine

## 2017-12-14 VITALS — BP 120/68 | HR 75 | Temp 98.0°F | Ht 63.75 in | Wt 140.4 lb

## 2017-12-14 DIAGNOSIS — Z124 Encounter for screening for malignant neoplasm of cervix: Secondary | ICD-10-CM

## 2017-12-14 DIAGNOSIS — Z8669 Personal history of other diseases of the nervous system and sense organs: Secondary | ICD-10-CM | POA: Diagnosis not present

## 2017-12-14 DIAGNOSIS — Z87891 Personal history of nicotine dependence: Secondary | ICD-10-CM

## 2017-12-14 DIAGNOSIS — Z0000 Encounter for general adult medical examination without abnormal findings: Secondary | ICD-10-CM | POA: Diagnosis not present

## 2017-12-14 DIAGNOSIS — Z803 Family history of malignant neoplasm of breast: Secondary | ICD-10-CM | POA: Diagnosis not present

## 2017-12-14 NOTE — Progress Notes (Signed)
   Subjective:    Patient ID: [name] [name], female    DOB: 09/26/1955, 62 y.o.   MRN: [id number]  HPI She is here for complete examination.  She has had a recent mammogram.  She does have migraine headaches but is not having any difficulty with them.  Her last Pap was in 2016.  She is a former smoker.  She has no other concerns or complaints.  Family and social history as well as health maintenance and immunizations was reviewed.   Review of Systems  All other systems reviewed and are negative.      Objective:   Physical Exam BP 120/68 (BP Location: Left Arm)   Pulse 75   Temp 98 F (36.7 C)   Ht 5' 3.75" (1.619 m)   Wt 140 lb 6.4 oz (63.7 kg)   SpO2 99%   BMI 24.29 kg/m   General Appearance:    Alert, cooperative, no distress, appears stated age  Head:    Normocephalic, without obvious abnormality, atraumatic  Eyes:    PERRL, conjunctiva/corneas clear, EOM's intact, fundi    benign  Ears:    Normal TM's and external ear canals  Nose:   Nares normal, mucosa normal, no drainage or sinus   tenderness  Throat:   Lips, mucosa, and tongue normal; teeth and gums normal  Neck:   Supple, no lymphadenopathy;  thyroid:  no   enlargement/tenderness/nodules; no carotid   bruit or JVD     Lungs:     Clear to auscultation bilaterally without wheezes, rales or     ronchi; respirations unlabored      Heart:    Regular rate and rhythm, S1 and S2 normal, no murmur, rub   or gallop  Breast Exam:   Deferred  Abdomen:     Soft, non-tender, nondistended, normoactive bowel sounds,    no masses, no hepatosplenomegaly  Genitalia:    Normal external genitalia without lesions.  BUS and vagina normal; cervix without lesions, or cervical motion tenderness. No abnormal vaginal discharge.  Uterus and adnexa not enlarged, nontender, no masses.  Pap performed  Rectal:   Deferred  Extremities:   No clubbing, cyanosis or edema  Pulses:   2+ and symmetric all extremities  Skin:   Skin color, texture,  turgor normal, no rashes or lesions  Lymph nodes:   Cervical, supraclavicular, and axillary nodes normal  Neurologic:   CNII-XII intact, normal strength, sensation and gait; reflexes 2+ and symmetric throughout          Psych:   Normal mood, affect, hygiene and grooming.         Assessment & Plan:  Routine general medical examination at a health care facility - Plan: CBC with Differential/Platelet, Comprehensive metabolic panel, Lipid panel  Family history of breast cancer in mother  Former smoker  History of migraine headaches  Screening for cervical cancer - Plan: Cytology - PAP([facility])

## 2017-12-15 LAB — COMPREHENSIVE METABOLIC PANEL
A/G RATIO: 1.9 (ref 1.2–2.2)
ALBUMIN: 4.4 g/dL (ref 3.5–5.5)
ALK PHOS: 57 IU/L (ref 39–117)
ALT: 21 IU/L (ref 0–32)
AST: 19 IU/L (ref 0–40)
BILIRUBIN TOTAL: 0.3 mg/dL (ref 0.0–1.2)
BUN / CREAT RATIO: 13 (ref 9–23)
BUN: 10 mg/dL (ref 6–24)
CHLORIDE: 99 mmol/L (ref 96–106)
CO2: 24 mmol/L (ref 20–29)
Calcium: 9.2 mg/dL (ref 8.7–10.2)
Creatinine, Ser: 0.77 mg/dL (ref 0.57–1.00)
GFR calc non Af Amer: 85 mL/min/{1.73_m2} (ref >59)
GFR, EST AFRICAN AMERICAN: 98 mL/min/{1.73_m2} (ref >59)
Globulin, Total: 2.3 g/dL (ref 1.5–4.5)
Glucose: 89 mg/dL (ref 65–99)
POTASSIUM: 4.3 mmol/L (ref 3.5–5.2)
Sodium: 138 mmol/L (ref 134–144)
Total Protein: 6.7 g/dL (ref 6.0–8.5)

## 2017-12-15 LAB — CBC WITH DIFFERENTIAL/PLATELET
BASOS ABS: 0 10*3/uL (ref 0.0–0.2)
BASOS: 0 %
EOS (ABSOLUTE): 0.3 10*3/uL (ref 0.0–0.4)
Eos: 3 %
HEMOGLOBIN: 14.6 g/dL (ref 11.1–15.9)
Hematocrit: 43.1 % (ref 34.0–46.6)
Immature Grans (Abs): 0 10*3/uL (ref 0.0–0.1)
Immature Granulocytes: 0 %
LYMPHS ABS: 3.6 10*3/uL — AB (ref 0.7–3.1)
Lymphs: 36 %
MCH: 30.7 pg (ref 26.6–33.0)
MCHC: 33.9 g/dL (ref 31.5–35.7)
MCV: 91 fL (ref 79–97)
Monocytes Absolute: 0.5 10*3/uL (ref 0.1–0.9)
Monocytes: 5 %
NEUTROS ABS: 5.4 10*3/uL (ref 1.4–7.0)
Neutrophils: 56 %
PLATELETS: 303 10*3/uL (ref 150–379)
RBC: 4.75 x10E6/uL (ref 3.77–5.28)
RDW: 13.6 % (ref 12.3–15.4)
WBC: 10 10*3/uL (ref 3.4–10.8)

## 2017-12-15 LAB — LIPID PANEL
CHOLESTEROL TOTAL: 172 mg/dL (ref 100–199)
Chol/HDL Ratio: 3 ratio (ref 0.0–4.4)
HDL: 58 mg/dL (ref >39)
LDL Calculated: 90 mg/dL (ref 0–99)
Triglycerides: 119 mg/dL (ref 0–149)
VLDL CHOLESTEROL CAL: 24 mg/dL (ref 5–40)

## 2017-12-17 LAB — CYTOLOGY - PAP: Diagnosis: NEGATIVE

## 2018-05-24 ENCOUNTER — Ambulatory Visit (INDEPENDENT_AMBULATORY_CARE_PROVIDER_SITE_OTHER): Payer: 59

## 2018-05-24 ENCOUNTER — Ambulatory Visit (HOSPITAL_COMMUNITY)
Admission: EM | Admit: 2018-05-24 | Discharge: 2018-05-24 | Disposition: A | Discharge status: Home / Self Care | Payer: 59 | Attending: Family Medicine | Admitting: Family Medicine

## 2018-05-24 ENCOUNTER — Encounter (HOSPITAL_COMMUNITY): Payer: Self-pay | Admitting: Emergency Medicine

## 2018-05-24 DIAGNOSIS — M546 Pain in thoracic spine: Secondary | ICD-10-CM

## 2018-05-24 MED ORDER — CYCLOBENZAPRINE HCL 5 MG PO TABS: 5.0000 mg | ORAL_TABLET | Freq: Every day | ORAL | 0 refills | Status: AC

## 2018-05-24 MED ORDER — KETOROLAC TROMETHAMINE 60 MG/2ML IM SOLN
60.0000 mg | Freq: Once | INTRAMUSCULAR | Status: AC
  Administered 2018-05-24: 60 mg via INTRAMUSCULAR

## 2018-05-24 MED ORDER — NAPROXEN 500 MG PO TABS: 500.0000 mg | ORAL_TABLET | Freq: Two times a day (BID) | ORAL | 0 refills | Status: AC

## 2018-05-24 MED ORDER — KETOROLAC TROMETHAMINE 60 MG/2ML IM SOLN
INTRAMUSCULAR | Status: AC
  Filled 2018-05-24: qty 2

## 2018-05-24 NOTE — Discharge Instructions (Signed)
Back x-ray normal, no fracture or abnormality  We gave you a shot of Toradol today which is like ibuprofen but a little bit stronger, that should kick in about 30 to 40 minutes  Use anti-inflammatories for pain/swelling. You may take up to 800 mg Ibuprofen every 8 hours OR Naprosyn twice daily with food. You may supplement Ibuprofen with Tylenol [phone] mg every 8 hours.   You may use flexeril as needed to help with pain. This is a muscle relaxer and causes sedation- please use only at bedtime or when you will be home and not have to drive/work  Alternate ice and heat to help with swelling and discomfort  Pain may take approximately 2 weeks to fully heal, but please continue to monitor symptoms in the meantime.  If worsening, developing numbness or tingling, shortness of breath, difficulty breathing please follow-up.

## 2018-05-24 NOTE — ED Provider Notes (Signed)
[place]    CSN: [id number] Arrival date & time: 05/24/18  1655     History   Chief Complaint Chief Complaint  Patient presents with  . Back Pain    HPI [name] [name] is a 62 y.o. female history of tobacco use presenting today for evaluation of back pain.  Patient states that one week ago she was setting up a trampoline, and felt a pull in her back.  Since she has had persistent pain located mainly in the middle of her back.  Occasionally will feel spasm and light lock.  Has been applying ice and heat and taking ibuprofen 400 mg daily without improvement.  Patient is concerned about the bones as she has not had any improvement.  Patient does admit that she has continued to have heavy lifting as part of her job.  Nuys numbness or tingling.  Denies any saddle anesthesia.  Denies any change in bowel or bladder habits.  HPI  Past Medical History:  Diagnosis Date  . Hemorrhoids   . Migraine headache   . Personal history of colonic adenoma 07/24/2008  . Smoker   . Trigeminal neuralgia    had brain surg in 1995    Patient Active Problem List   Diagnosis Date Noted  . History of migraine headaches 10/14/2015  . Former smoker 10/14/2015  . Family history of breast cancer in mother 06/08/2013  . History of colonic polyps 07/24/2008    Past Surgical History:  Procedure Laterality Date  . APPENDECTOMY    . BRAIN SURGERY  1995   Trigeminal nerve surgery  . COLONOSCOPY    . KNEE ARTHROSCOPY     right knee  . TUBAL LIGATION      OB History   None      Home Medications    Prior to Admission medications   Medication Sig Start Date End Date Taking? Authorizing Provider  cyclobenzaprine (FLEXERIL) 5 MG tablet Take 1 tablet (5 mg total) by mouth at bedtime. 05/24/18   Wieters, Hallie C, PA-C  Multiple Vitamins-Minerals (MULTIVITAMIN WITH MINERALS) tablet Take 1 tablet by mouth daily.      [provider]  naproxen (NAPROSYN) 500 MG tablet Take 1  tablet (500 mg total) by mouth 2 (two) times daily. 05/24/18   Wieters, Hallie C, PA-C  Naproxen Sodium (ALEVE PO) Take by mouth daily.    [provider]  ondansetron (ZOFRAN) 4 MG tablet Take 1 tablet (4 mg total) by mouth every 8 (eight) hours as needed for nausea or vomiting. 10/22/17   Tysinger, Camelia Eng, PA-C    Family History Family History  Problem Relation Age of Onset  . Diabetes Mother   . Hyperlipidemia Mother   . Hypertension Mother   . Breast cancer Mother        in 53's or 69's  . Heart disease Father 33       MI  . Colon cancer Neg Hx     Social History Social History   Tobacco Use  . Smoking status: Former Smoker    Packs/day: 1.00    Years: 20.00    Pack years: 20.00    Types: Cigarettes    Last attempt to quit: 02/11/2014    Years since quitting: 4.2  . Smokeless tobacco: Never Used  Substance Use Topics  . Alcohol use: No    Alcohol/week: 0.0 standard drinks  . Drug use: No     Allergies   Patient has no known allergies.  Review of Systems Review of Systems  Constitutional: Negative for fatigue and fever.  Eyes: Negative for visual disturbance.  Respiratory: Negative for shortness of breath.   Cardiovascular: Negative for chest pain.  Gastrointestinal: Negative for abdominal pain, nausea and vomiting.  Musculoskeletal: Positive for back pain and myalgias. Negative for arthralgias and joint swelling.  Skin: Negative for color change, rash and wound.  Neurological: Negative for dizziness, weakness, light-headedness and headaches.     Physical Exam Triage Vital Signs ED Triage Vitals  Enc Vitals Group     BP 05/24/18 1738 119/76     Pulse Rate 05/24/18 1738 72     Resp 05/24/18 1738 16     Temp 05/24/18 1738 98.4 F (36.9 C)     Temp Source 05/24/18 1738 Oral     SpO2 05/24/18 1738 97 %     Weight --      Height --      Head Circumference --      Peak Flow --      Pain Score 05/24/18 1840 6     Pain Loc --      Pain Edu? --       Excl. in [place]? --    No data found.  Updated Vital Signs BP 119/76 (BP Location: Left Arm)   Pulse 72   Temp 98.4 F (36.9 C) (Oral)   Resp 16   SpO2 97%   Visual Acuity Right Eye Distance:   Left Eye Distance:   Bilateral Distance:    Right Eye Near:   Left Eye Near:    Bilateral Near:     Physical Exam  Constitutional: She is oriented to person, place, and time. She appears well-developed and well-nourished.  No acute distress  HENT:  Head: Normocephalic and atraumatic.  Nose: Nose normal.  Eyes: Conjunctivae are normal.  Neck: Neck supple.  Cardiovascular: Normal rate.  Pulmonary/Chest: Effort normal. No respiratory distress.  Abdominal: She exhibits no distension.  Musculoskeletal: Normal range of motion.  Nontender to palpation of cervical and lumbar spine, tenderness to thoracic spine midline, tenderness throughout bilateral thoracic musculature, positions slightly facing towards right side  Neurological: She is alert and oriented to person, place, and time.  Skin: Skin is warm and dry.  Psychiatric: She has a normal mood and affect.  Nursing note and vitals reviewed.    UC Treatments / Results  Labs (all labs ordered are listed, but only abnormal results are displayed) Labs Reviewed - No data to display  EKG None  Radiology Dg Thoracic Spine 2 View  Result Date: 05/24/2018 CLINICAL DATA:  Pain at the scapula EXAM: THORACIC SPINE 2 VIEWS COMPARISON:  Chest x-ray 06/02/2007 FINDINGS: Alignment within normal limits. Vertebral body heights are maintained. Disc spaces are within normal limits. IMPRESSION: Negative. Electronically Signed   By: Donavan Foil M.D.   On: 05/24/2018 18:28    Procedures Procedures (including critical care time)  Medications Ordered in UC Medications  ketorolac (TORADOL) injection 60 mg (60 mg Intramuscular Given 05/24/18 1819)    Initial Impression / Assessment and Plan / UC Course  I have reviewed the triage vital signs  and the nursing notes.  Pertinent labs & imaging results that were available during my care of the patient were reviewed by me and considered in my medical decision making (see chart for details).     T-spine x-ray normal, no signs of acute bony abnormality.  Will provide Toradol injection in clinic today.  Continue anti-inflammatories, muscle  relaxer.  Discussed sedation regarding Flexeril.  Continue ice and heat.  Discussed expected timeline of pain.Discussed strict return precautions. Patient verbalized understanding and is agreeable with plan.  Final Clinical Impressions(s) / UC Diagnoses   Final diagnoses:  Acute bilateral thoracic back pain     Discharge Instructions     Back x-ray normal, no fracture or abnormality  We gave you a shot of Toradol today which is like ibuprofen but a little bit stronger, that should kick in about 30 to 40 minutes  Use anti-inflammatories for pain/swelling. You may take up to 800 mg Ibuprofen every 8 hours OR Naprosyn twice daily with food. You may supplement Ibuprofen with Tylenol [id number] mg every 8 hours.   You may use flexeril as needed to help with pain. This is a muscle relaxer and causes sedation- please use only at bedtime or when you will be home and not have to drive/work  Alternate ice and heat to help with swelling and discomfort  Pain may take approximately 2 weeks to fully heal, but please continue to monitor symptoms in the meantime.  If worsening, developing numbness or tingling, shortness of breath, difficulty breathing please follow-up.    ED Prescriptions    Medication Sig Dispense Auth. Provider   naproxen (NAPROSYN) 500 MG tablet Take 1 tablet (500 mg total) by mouth 2 (two) times daily. 30 tablet Wieters, Hallie C, PA-C   cyclobenzaprine (FLEXERIL) 5 MG tablet Take 1 tablet (5 mg total) by mouth at bedtime. 14 tablet Wieters, Munden C, PA-C     Controlled Substance Prescriptions [place] Controlled Substance Registry  consulted? Not Applicable   Janith Lima, [place] 05/24/18 1843

## 2018-05-24 NOTE — ED Triage Notes (Signed)
Triaged by Provider. 

## 2018-06-01 ENCOUNTER — Ambulatory Visit (HOSPITAL_COMMUNITY)
Admission: EM | Admit: 2018-06-01 | Discharge: 2018-06-01 | Disposition: A | Discharge status: Home / Self Care | Payer: 59 | Attending: Family Medicine | Admitting: Family Medicine

## 2018-06-01 ENCOUNTER — Encounter (HOSPITAL_COMMUNITY): Payer: Self-pay | Admitting: Emergency Medicine

## 2018-06-01 DIAGNOSIS — R202 Paresthesia of skin: Secondary | ICD-10-CM

## 2018-06-01 DIAGNOSIS — M546 Pain in thoracic spine: Secondary | ICD-10-CM

## 2018-06-01 MED ORDER — METHYLPREDNISOLONE ACETATE 40 MG/ML IJ SUSP
40.0000 mg | Freq: Once | INTRAMUSCULAR | Status: AC
  Administered 2018-06-01: 40 mg via INTRAMUSCULAR

## 2018-06-01 MED ORDER — METHYLPREDNISOLONE ACETATE 40 MG/ML IJ SUSP
INTRAMUSCULAR | Status: AC
  Filled 2018-06-01: qty 1

## 2018-06-01 MED ORDER — PREDNISONE 10 MG (21) PO TBPK: ORAL_TABLET | ORAL | 0 refills | Status: AC

## 2018-06-01 NOTE — Discharge Instructions (Signed)
It was nice meeting you!!  We are giving you a steroid injection here in clinic We sent some prednisone to the pharmacy. Massage on the area could really help along with using a tennis ball. Follow up as needed for continued or worsening symptoms

## 2018-06-01 NOTE — ED Provider Notes (Signed)
[place]    CSN: [id number] Arrival date & time: 06/01/18  1414     History   Chief Complaint Chief Complaint  Patient presents with  . Back Pain    HPI [name] [name] is a 62 y.o. female.   She is a 62 year old female that presents with continued upper back pain more on the left upper back just below the scapula.  She was seen here 2 weeks ago with normal x-ray readings and prescribed naproxen and Flexeril for symptoms.  She has been taking naproxen daily and muscle relaxant on the weekends.  She is also been doing light duty at work and not lifting.  She is concerned today because now there is a numbness in the left upper back.  Reports the pain has somewhat improved.  She denies any radiation of pain into her arm or hand.  She is not having any extremity weakness.  She denies any fever, chills.  She denies any chest pain, shortness of breath or palpitations.   ROS per HPI      Past Medical History:  Diagnosis Date  . Hemorrhoids   . Migraine headache   . Personal history of colonic adenoma 07/24/2008  . Smoker   . Trigeminal neuralgia    had brain surg in 1995    Patient Active Problem List   Diagnosis Date Noted  . History of migraine headaches 10/14/2015  . Former smoker 10/14/2015  . Family history of breast cancer in mother 06/08/2013  . History of colonic polyps 07/24/2008    Past Surgical History:  Procedure Laterality Date  . APPENDECTOMY    . BRAIN SURGERY  1995   Trigeminal nerve surgery  . COLONOSCOPY    . KNEE ARTHROSCOPY     right knee  . TUBAL LIGATION      OB History   None      Home Medications    Prior to Admission medications   Medication Sig Start Date End Date Taking? Authorizing Provider  cyclobenzaprine (FLEXERIL) 5 MG tablet Take 1 tablet (5 mg total) by mouth at bedtime. 05/24/18   Wieters, Hallie C, PA-C  Multiple Vitamins-Minerals (MULTIVITAMIN WITH MINERALS) tablet Take 1 tablet by mouth daily.       [provider]  naproxen (NAPROSYN) 500 MG tablet Take 1 tablet (500 mg total) by mouth 2 (two) times daily. 05/24/18   Wieters, Hallie C, PA-C  Naproxen Sodium (ALEVE PO) Take by mouth daily.    [provider]  ondansetron (ZOFRAN) 4 MG tablet Take 1 tablet (4 mg total) by mouth every 8 (eight) hours as needed for nausea or vomiting. Patient not taking: Reported on 06/01/2018 10/22/17   Tysinger, Camelia Eng, PA-C  predniSONE (STERAPRED UNI-PAK 21 TAB) 10 MG (21) TBPK tablet 6 tabs for 1 day, then 5 tabs for 1 das, then 4 tabs for 1 day, then 3 tabs for 1 day, 2 tabs for 1 day, then 1 tab for 1 day 06/01/18   Orvan July, NP    Family History Family History  Problem Relation Age of Onset  . Diabetes Mother   . Hyperlipidemia Mother   . Hypertension Mother   . Breast cancer Mother        in 75's or 62's  . Heart disease Father 26       MI  . Colon cancer Neg Hx     Social History Social History   Tobacco Use  . Smoking status: Former Smoker  Packs/day: 1.00    Years: 20.00    Pack years: 20.00    Types: Cigarettes    Last attempt to quit: 02/11/2014    Years since quitting: 4.3  . Smokeless tobacco: Never Used  Substance Use Topics  . Alcohol use: No    Alcohol/week: 0.0 standard drinks  . Drug use: No     Allergies   Patient has no known allergies.   Review of Systems Review of Systems   Physical Exam Triage Vital Signs ED Triage Vitals [06/01/18 1521]  Enc Vitals Group     BP 105/73     Pulse Rate 69     Resp 18     Temp 98 F (36.7 C)     Temp src      SpO2 100 %     Weight      Height      Head Circumference      Peak Flow      Pain Score      Pain Loc      Pain Edu?      Excl. in [place]?    No data found.  Updated Vital Signs BP 105/73   Pulse 69   Temp 98 F (36.7 C)   Resp 18   SpO2 100%   Visual Acuity Right Eye Distance:   Left Eye Distance:   Bilateral Distance:    Right Eye Near:   Left Eye Near:    Bilateral  Near:     Physical Exam  Constitutional: She is oriented to person, place, and time. She appears well-developed and well-nourished.  Very pleasant. Non toxic or ill appearing.     HENT:  Head: Normocephalic.  Eyes: Conjunctivae are normal.  Neck: Normal range of motion.  Pulmonary/Chest: Effort normal.  Musculoskeletal: Normal range of motion.  She has normal range of motion of the arms, neck and shoulders. Paresthesia to the left upper back just under her scapula. Mild tenderness to deep palpation. No obvious bruising, deformities. No thoracic bony tenderness  Neurological: She is alert and oriented to person, place, and time.  Skin: Skin is warm and dry.  Psychiatric: She has a normal mood and affect.  Nursing note and vitals reviewed.    UC Treatments / Results  Labs (all labs ordered are listed, but only abnormal results are displayed) Labs Reviewed - No data to display  EKG None  Radiology No results found.  Procedures Procedures (including critical care time)  Medications Ordered in UC Medications  methylPREDNISolone acetate (DEPO-MEDROL) injection 40 mg (has no administration in time range)    Initial Impression / Assessment and Plan / UC Course  I have reviewed the triage vital signs and the nursing notes.  Pertinent labs & imaging results that were available during my care of the patient were reviewed by me and considered in my medical decision making (see chart for details).     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  Mainstay treatment is manipulation of area and massage. Instructed to get a tennis ball and placed on area against the wall and massage area. Instructed that it could take weeks or longer for this to heal. Patient understanding and agreeable to plan Extended work note to limit heavy lifting for another week Final Clinical Impressions(s) / UC Diagnoses   Final  diagnoses:  Notalgia paresthetica     Discharge Instructions     It was  nice meeting you!!  We are giving you a steroid injection here in clinic We sent some prednisone to the pharmacy. Massage on the area could really help along with using a tennis ball. Follow up as needed for continued or worsening symptoms      ED Prescriptions    Medication Sig Dispense Auth. Provider   predniSONE (STERAPRED UNI-PAK 21 TAB) 10 MG (21) TBPK tablet 6 tabs for 1 day, then 5 tabs for 1 das, then 4 tabs for 1 day, then 3 tabs for 1 day, 2 tabs for 1 day, then 1 tab for 1 day 21 tablet Rozanna Box, Kasen Adduci A, NP     Controlled Substance Prescriptions [place] Controlled Substance Registry consulted? Not Applicable   Orvan July, NP 06/01/18 [phone]

## 2018-06-01 NOTE — ED Triage Notes (Signed)
Pt states she was seen last week for back pain, states its not getting better. Pt now c/o numbness in her back.

## 2018-10-31 ENCOUNTER — Other Ambulatory Visit: Payer: Self-pay | Admitting: Family Medicine

## 2018-10-31 DIAGNOSIS — Z1231 Encounter for screening mammogram for malignant neoplasm of breast: Secondary | ICD-10-CM

## 2018-12-02 ENCOUNTER — Ambulatory Visit: Payer: 59

## 2019-02-09 ENCOUNTER — Telehealth: Payer: Self-pay

## 2019-02-09 NOTE — Telephone Encounter (Signed)
Pt was called to see if she felt comdortable coming in and to do screening. [place]

## 2019-02-16 ENCOUNTER — Other Ambulatory Visit: Payer: Self-pay

## 2019-02-16 ENCOUNTER — Encounter: Payer: Self-pay | Admitting: Family Medicine

## 2019-02-16 ENCOUNTER — Ambulatory Visit: Payer: 59 | Admitting: Family Medicine

## 2019-02-16 VITALS — BP 116/78 | HR 72 | Temp 98.5°F | Ht 63.75 in | Wt 138.4 lb

## 2019-02-16 DIAGNOSIS — Z23 Encounter for immunization: Secondary | ICD-10-CM | POA: Diagnosis not present

## 2019-02-16 DIAGNOSIS — Z86010 Personal history of colonic polyps: Secondary | ICD-10-CM | POA: Diagnosis not present

## 2019-02-16 DIAGNOSIS — Z803 Family history of malignant neoplasm of breast: Secondary | ICD-10-CM

## 2019-02-16 DIAGNOSIS — Z0000 Encounter for general adult medical examination without abnormal findings: Secondary | ICD-10-CM | POA: Diagnosis not present

## 2019-02-16 DIAGNOSIS — Z8669 Personal history of other diseases of the nervous system and sense organs: Secondary | ICD-10-CM

## 2019-02-16 LAB — LIPID PANEL

## 2019-02-16 NOTE — Progress Notes (Signed)
   Subjective:    Patient ID: [name] [name], female    DOB: [birth year], 63 y.o.   MRN: [id number]  HPI She is here for a complete examination.  She has no particular concerns or complaints.  Presently she is on no medications.  She does have a family history of breast cancer in her mother.  She does get regular mammograms.  She also is a former smoker.  She is having no more difficulties with migraines.  Apparently she did have a surgical procedure to help with her migraines.  She also has a history of colonic polyps however they were hyperplastic.  Family and social history as well as health maintenance and immunizations was reviewed   Review of Systems  All other systems reviewed and are negative.      Objective:   Physical Exam BP 116/78 (BP Location: Left Arm, Patient Position: Sitting)   Pulse 72   Temp 98.5 F (36.9 C)   Ht 5' 3.75" (1.619 m)   Wt 138 lb 6.4 oz (62.8 kg)   SpO2 96%   BMI 23.94 kg/m   General Appearance:    Alert, cooperative, no distress, appears stated age  Head:    Normocephalic, without obvious abnormality, atraumatic  Eyes:    PERRL, conjunctiva/corneas clear, EOM's intact, fundi    benign  Ears:    Normal TM's and external ear canals  Nose:   Nares normal, mucosa normal, no drainage or sinus   tenderness  Throat:   Lips, mucosa, and tongue normal; teeth and gums normal  Neck:   Supple, no lymphadenopathy;  thyroid:  no   enlargement/tenderness/nodules; no carotid   bruit or JVD  Back:    Spine nontender, no curvature, ROM normal, no CVA     tenderness  Lungs:     Clear to auscultation bilaterally without wheezes, rales or     ronchi; respirations unlabored  Chest Wall:    No tenderness or deformity   Heart:    Regular rate and rhythm, S1 and S2 normal, no murmur, rub   or gallop  Breast Exam:    Deferred to GYN  Abdomen:     Soft, non-tender, nondistended, normoactive bowel sounds,    no masses, no hepatosplenomegaly  Genitalia:    Deferred to  GYN     Extremities:   No clubbing, cyanosis or edema  Pulses:   2+ and symmetric all extremities  Skin:   Skin color, texture, turgor normal, no rashes or lesions  Lymph nodes:   Cervical, supraclavicular, and axillary nodes normal  Neurologic:   CNII-XII intact, normal strength, sensation and gait; reflexes 2+ and symmetric throughout          Psych:   Normal mood, affect, hygiene and grooming.      Assessment & Plan:  Need for shingles vaccine - Plan: Varicella-zoster vaccine IM (Shingrix), scheduled for follow-up shot in 2 months Routine general medical examination at a health care facility - Plan: Comprehensive metabolic panel, CBC with Differential/Platelet, Lipid panel,   Family history of breast cancer History of migraine headaches -  At the present time she is doing quite nicely and no need for further intervention.  She will be set up for routine Pap as well as mammogram previously scheduled

## 2019-02-17 LAB — CBC WITH DIFFERENTIAL/PLATELET
Basophils Absolute: 0.1 10*3/uL (ref 0.0–0.2)
Basos: 1 %
EOS (ABSOLUTE): 0.2 10*3/uL (ref 0.0–0.4)
Eos: 2 %
Hematocrit: 46 % (ref 34.0–46.6)
Hemoglobin: 15.8 g/dL (ref 11.1–15.9)
Immature Grans (Abs): 0 10*3/uL (ref 0.0–0.1)
Immature Granulocytes: 0 %
Lymphocytes Absolute: 2.4 10*3/uL (ref 0.7–3.1)
Lymphs: 19 %
MCH: 31.5 pg (ref 26.6–33.0)
MCHC: 34.3 g/dL (ref 31.5–35.7)
MCV: 92 fL (ref 79–97)
Monocytes Absolute: 0.7 10*3/uL (ref 0.1–0.9)
Monocytes: 6 %
Neutrophils Absolute: 9.3 10*3/uL — ABNORMAL HIGH (ref 1.4–7.0)
Neutrophils: 72 %
Platelets: 320 10*3/uL (ref 150–450)
RBC: 5.02 x10E6/uL (ref 3.77–5.28)
RDW: 12.9 % (ref 11.7–15.4)
WBC: 12.8 10*3/uL — ABNORMAL HIGH (ref 3.4–10.8)

## 2019-02-17 LAB — COMPREHENSIVE METABOLIC PANEL
ALT: 65 IU/L — ABNORMAL HIGH (ref 0–32)
AST: 49 IU/L — ABNORMAL HIGH (ref 0–40)
Albumin/Globulin Ratio: 2 (ref 1.2–2.2)
Albumin: 4.4 g/dL (ref 3.8–4.9)
Alkaline Phosphatase: 72 IU/L (ref 39–117)
BUN/Creatinine Ratio: 8 — ABNORMAL LOW (ref 12–28)
BUN: 6 mg/dL — ABNORMAL LOW (ref 8–27)
Bilirubin Total: 0.5 mg/dL (ref 0.0–1.2)
CO2: 23 mmol/L (ref 20–29)
Calcium: 9.4 mg/dL (ref 8.7–10.3)
Chloride: 103 mmol/L (ref 96–106)
Creatinine, Ser: 0.77 mg/dL (ref 0.57–1.00)
GFR calc Af Amer: 97 mL/min/{1.73_m2} (ref >59)
GFR calc non Af Amer: 84 mL/min/{1.73_m2} (ref >59)
Globulin, Total: 2.2 g/dL (ref 1.5–4.5)
Glucose: 94 mg/dL (ref 65–99)
Potassium: 4.7 mmol/L (ref 3.5–5.2)
Sodium: 140 mmol/L (ref 134–144)
Total Protein: 6.6 g/dL (ref 6.0–8.5)

## 2019-02-17 LAB — LIPID PANEL
Chol/HDL Ratio: 2.8 ratio (ref 0.0–4.4)
Cholesterol, Total: 195 mg/dL (ref 100–199)
HDL: 69 mg/dL (ref >39)
LDL Calculated: 102 mg/dL — ABNORMAL HIGH (ref 0–99)
Triglycerides: 118 mg/dL (ref 0–149)
VLDL Cholesterol Cal: 24 mg/dL (ref 5–40)

## 2019-03-24 ENCOUNTER — Ambulatory Visit
Admission: RE | Admit: 2019-03-24 | Discharge: 2019-03-24 | Disposition: A | Discharge status: Home / Self Care | Payer: 59 | Source: Ambulatory Visit | Attending: Family Medicine | Admitting: Family Medicine

## 2019-03-24 ENCOUNTER — Other Ambulatory Visit: Payer: Self-pay

## 2019-03-24 DIAGNOSIS — Z1231 Encounter for screening mammogram for malignant neoplasm of breast: Secondary | ICD-10-CM

## 2019-05-05 ENCOUNTER — Other Ambulatory Visit: Payer: Self-pay

## 2019-05-05 ENCOUNTER — Other Ambulatory Visit (INDEPENDENT_AMBULATORY_CARE_PROVIDER_SITE_OTHER): Payer: 59

## 2019-05-05 DIAGNOSIS — Z23 Encounter for immunization: Secondary | ICD-10-CM | POA: Diagnosis not present

## 2020-05-22 IMAGING — MG DIGITAL SCREENING BILATERAL MAMMOGRAM WITH TOMO AND CAD
8 series · 9 of 24 positions shown · non-contrast
Comparison: Previous exam(s).

CLINICAL DATA: Screening.

EXAM:
DIGITAL SCREENING BILATERAL MAMMOGRAM WITH TOMO AND CAD

[L MLO synth-2D]
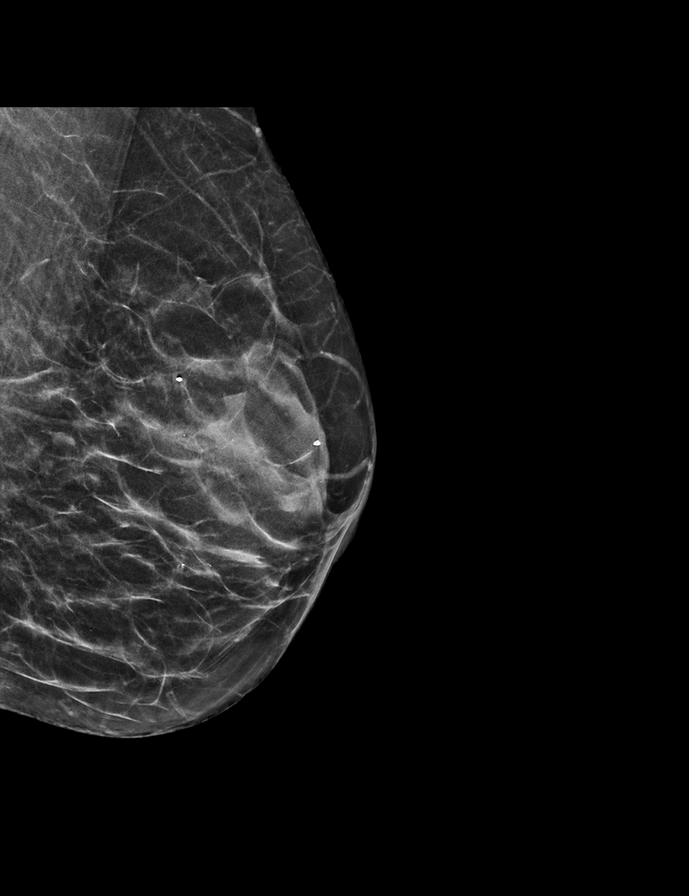

[L CC synth-2D]
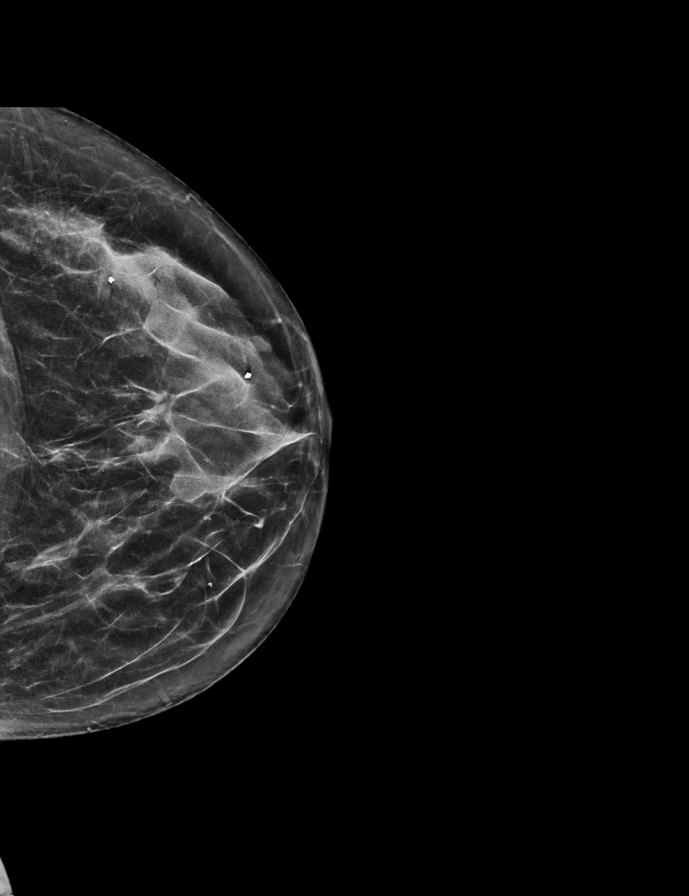

[R MLO synth-2D]
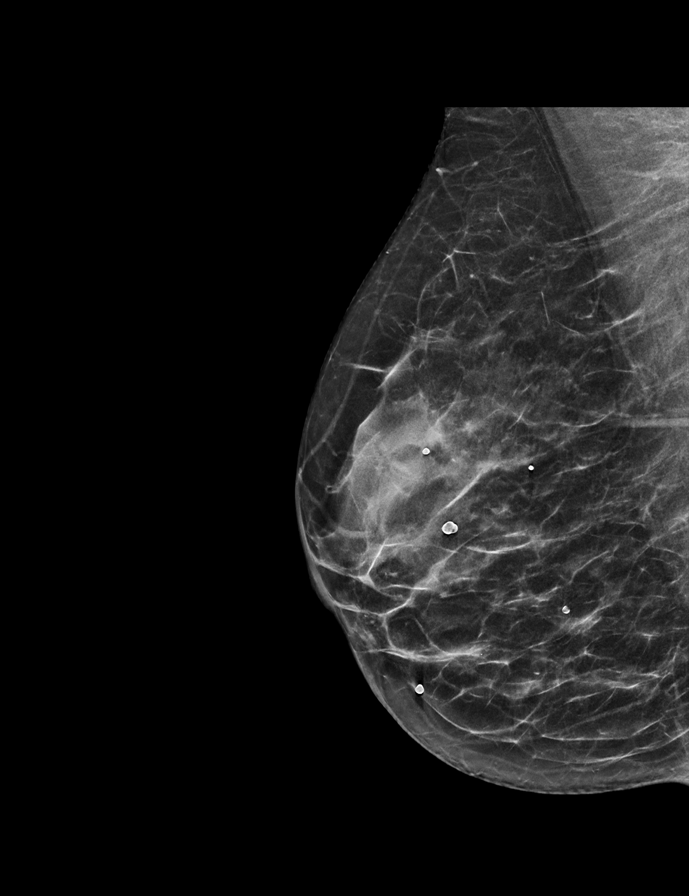

[R CC synth-2D]
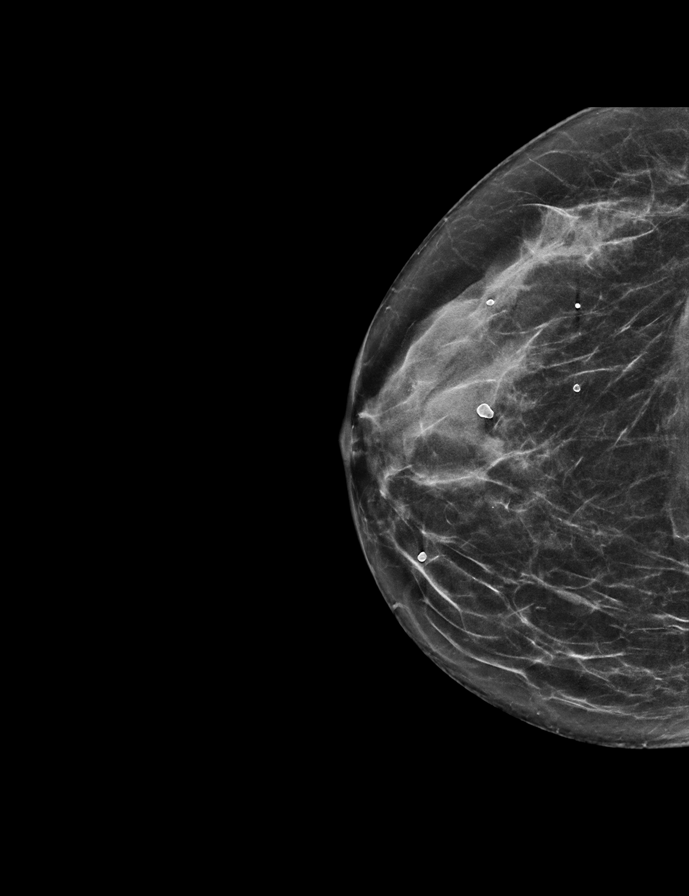

[R CC tomo · 2 of 57 frames shown]
[frame 19/57]
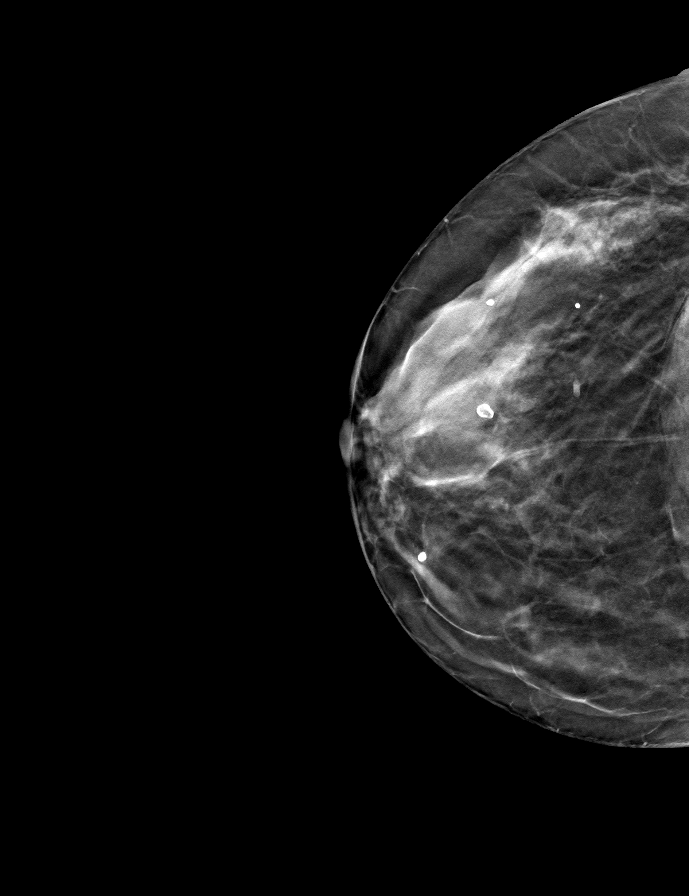
[frame 29/57]
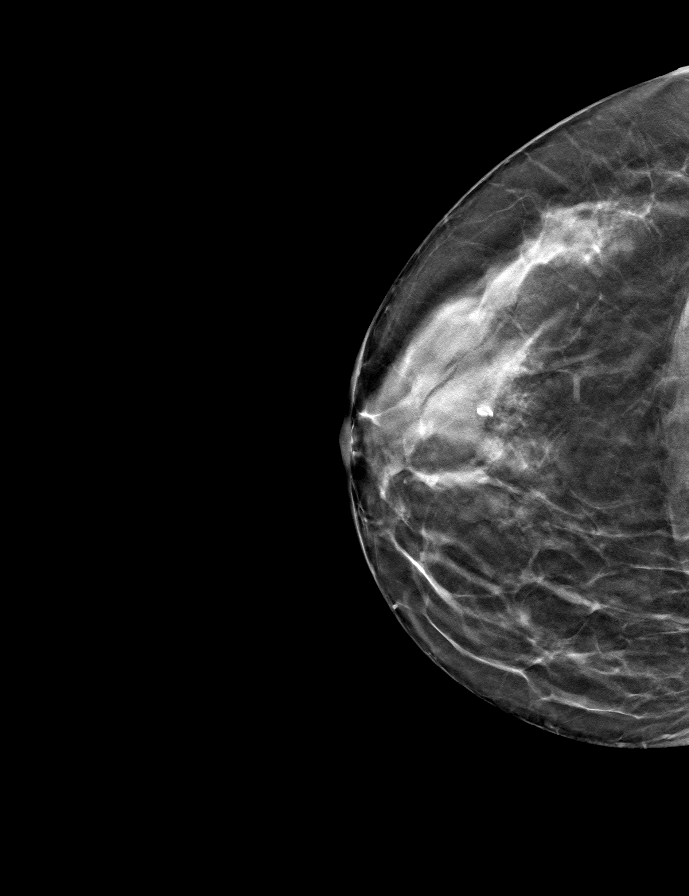

[R MLO tomo · tomo slice 27/53.0]
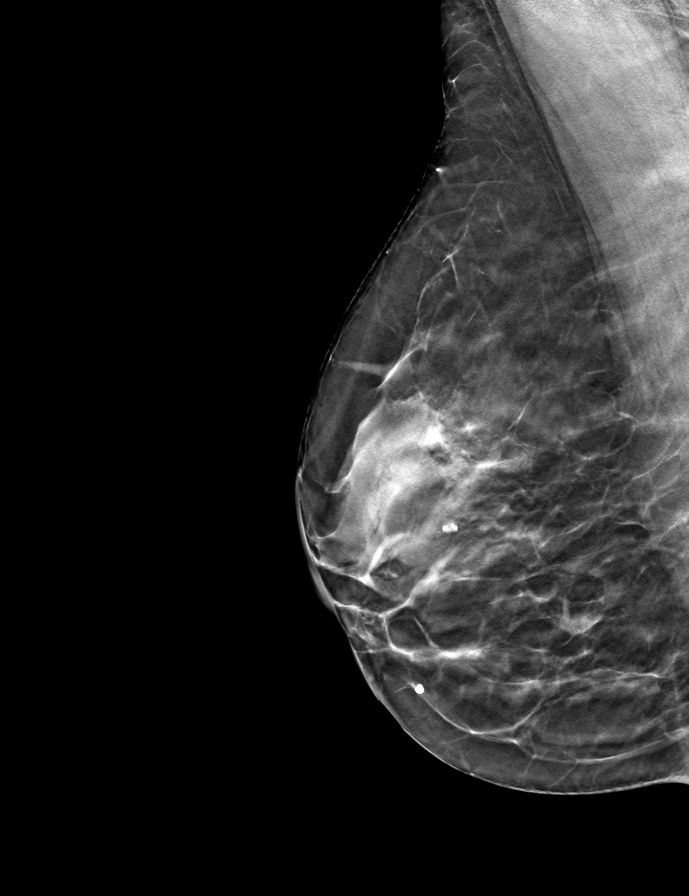

[L CC tomo · tomo slice 27/53.0]
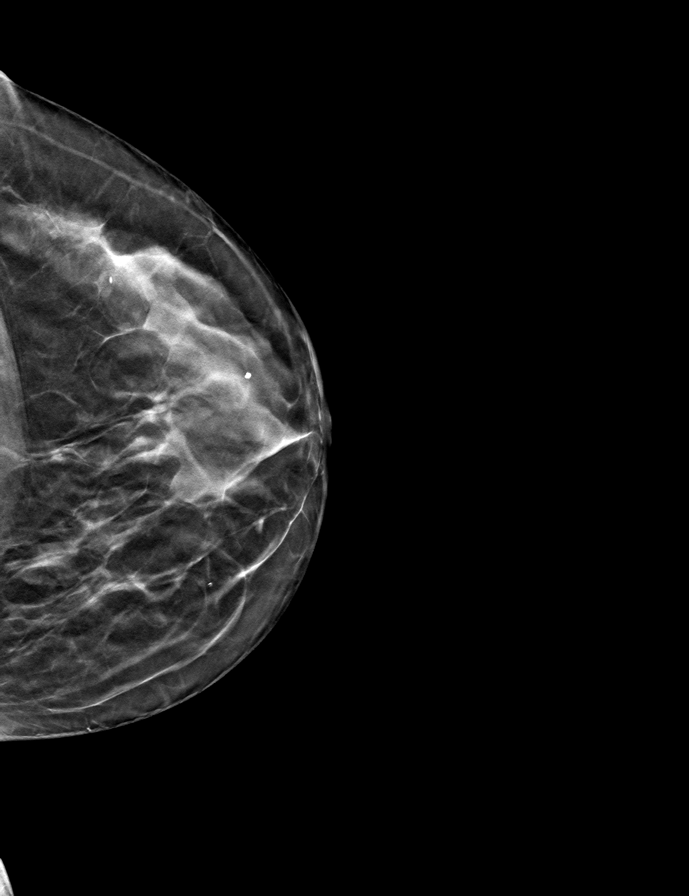

[L MLO tomo · tomo slice 24/47.0]
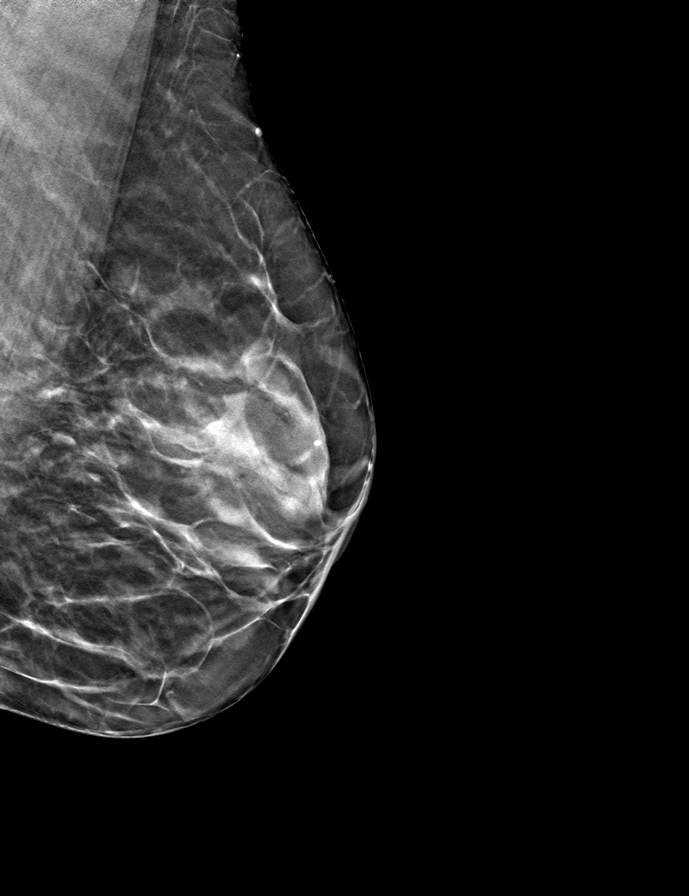

[9 of 24 positions shown; findings below may reference images not displayed]

ACR Breast Density Category c: The breast tissue is heterogeneously
dense, which may obscure small masses.
FINDINGS: There are no findings suspicious for malignancy. Images were
processed with CAD.
IMPRESSION: No mammographic evidence of malignancy. A result letter of this
screening mammogram will be mailed directly to the patient.

RECOMMENDATION:
Screening mammogram in one year. (Code:FT-U-LHB)

BI-RADS CATEGORY  1: Negative.
